# Patient Record
Sex: Female | Born: 1960 | Race: White | Hispanic: No | Marital: Married | Smoking: Former smoker
Health system: Southern US, Community
[De-identification: ages and names within clinical notes are randomized; demographics above are authoritative.]

## PROBLEM LIST (undated history)

## (undated) DIAGNOSIS — E7800 Pure hypercholesterolemia, unspecified: Secondary | ICD-10-CM

## (undated) DIAGNOSIS — E119 Type 2 diabetes mellitus without complications: Secondary | ICD-10-CM

## (undated) DIAGNOSIS — I10 Essential (primary) hypertension: Secondary | ICD-10-CM

## (undated) DIAGNOSIS — C801 Malignant (primary) neoplasm, unspecified: Secondary | ICD-10-CM

## (undated) DIAGNOSIS — K219 Gastro-esophageal reflux disease without esophagitis: Secondary | ICD-10-CM

## (undated) DIAGNOSIS — Z87442 Personal history of urinary calculi: Secondary | ICD-10-CM

## (undated) DIAGNOSIS — M1990 Unspecified osteoarthritis, unspecified site: Secondary | ICD-10-CM

## (undated) DIAGNOSIS — I499 Cardiac arrhythmia, unspecified: Secondary | ICD-10-CM

## (undated) HISTORY — PX: KNEE ARTHROSCOPY: SUR90

## (undated) HISTORY — PX: COLONOSCOPY: SHX174

## (undated) HISTORY — DX: Type 2 diabetes mellitus without complications: E11.9

## (undated) HISTORY — PX: WISDOM TOOTH EXTRACTION: SHX21

## (undated) HISTORY — PX: OTHER SURGICAL HISTORY: SHX169

---

## 1999-11-22 ENCOUNTER — Encounter: Payer: Self-pay | Admitting: Emergency Medicine

## 1999-11-22 ENCOUNTER — Emergency Department (HOSPITAL_COMMUNITY): Admission: EM | Admit: 1999-11-22 | Discharge: 1999-11-22 | Payer: Self-pay | Admitting: Emergency Medicine

## 1999-12-10 ENCOUNTER — Encounter: Payer: Self-pay | Admitting: Urology

## 1999-12-10 ENCOUNTER — Encounter: Admission: RE | Admit: 1999-12-10 | Discharge: 1999-12-10 | Payer: Self-pay | Admitting: Urology

## 2001-12-22 ENCOUNTER — Emergency Department (HOSPITAL_COMMUNITY): Admission: EM | Admit: 2001-12-22 | Discharge: 2001-12-22 | Payer: Self-pay | Admitting: Emergency Medicine

## 2001-12-22 ENCOUNTER — Encounter: Payer: Self-pay | Admitting: Emergency Medicine

## 2004-07-15 ENCOUNTER — Other Ambulatory Visit: Admission: RE | Admit: 2004-07-15 | Discharge: 2004-07-15 | Payer: Self-pay | Admitting: Obstetrics & Gynecology

## 2010-05-05 ENCOUNTER — Encounter: Admission: RE | Admit: 2010-05-05 | Discharge: 2010-05-05 | Payer: Self-pay | Admitting: Family Medicine

## 2017-01-06 DIAGNOSIS — Z1231 Encounter for screening mammogram for malignant neoplasm of breast: Secondary | ICD-10-CM | POA: Diagnosis not present

## 2017-01-06 DIAGNOSIS — Z01419 Encounter for gynecological examination (general) (routine) without abnormal findings: Secondary | ICD-10-CM | POA: Diagnosis not present

## 2017-01-26 DIAGNOSIS — E119 Type 2 diabetes mellitus without complications: Secondary | ICD-10-CM | POA: Diagnosis not present

## 2017-01-26 DIAGNOSIS — I129 Hypertensive chronic kidney disease with stage 1 through stage 4 chronic kidney disease, or unspecified chronic kidney disease: Secondary | ICD-10-CM | POA: Diagnosis not present

## 2017-01-26 DIAGNOSIS — Z0000 Encounter for general adult medical examination without abnormal findings: Secondary | ICD-10-CM | POA: Diagnosis not present

## 2017-01-26 DIAGNOSIS — E7800 Pure hypercholesterolemia, unspecified: Secondary | ICD-10-CM | POA: Diagnosis not present

## 2017-08-04 DIAGNOSIS — E7800 Pure hypercholesterolemia, unspecified: Secondary | ICD-10-CM | POA: Diagnosis not present

## 2017-08-04 DIAGNOSIS — E119 Type 2 diabetes mellitus without complications: Secondary | ICD-10-CM | POA: Diagnosis not present

## 2017-08-04 DIAGNOSIS — I129 Hypertensive chronic kidney disease with stage 1 through stage 4 chronic kidney disease, or unspecified chronic kidney disease: Secondary | ICD-10-CM | POA: Diagnosis not present

## 2017-09-20 DIAGNOSIS — E119 Type 2 diabetes mellitus without complications: Secondary | ICD-10-CM | POA: Diagnosis not present

## 2018-02-17 DIAGNOSIS — E7800 Pure hypercholesterolemia, unspecified: Secondary | ICD-10-CM | POA: Diagnosis not present

## 2018-02-17 DIAGNOSIS — Z0000 Encounter for general adult medical examination without abnormal findings: Secondary | ICD-10-CM | POA: Diagnosis not present

## 2018-02-17 DIAGNOSIS — I129 Hypertensive chronic kidney disease with stage 1 through stage 4 chronic kidney disease, or unspecified chronic kidney disease: Secondary | ICD-10-CM | POA: Diagnosis not present

## 2018-02-17 DIAGNOSIS — E119 Type 2 diabetes mellitus without complications: Secondary | ICD-10-CM | POA: Diagnosis not present

## 2018-06-07 DIAGNOSIS — Z124 Encounter for screening for malignant neoplasm of cervix: Secondary | ICD-10-CM | POA: Diagnosis not present

## 2018-06-07 DIAGNOSIS — Z6836 Body mass index (BMI) 36.0-36.9, adult: Secondary | ICD-10-CM | POA: Diagnosis not present

## 2018-06-07 DIAGNOSIS — Z01419 Encounter for gynecological examination (general) (routine) without abnormal findings: Secondary | ICD-10-CM | POA: Diagnosis not present

## 2018-06-07 DIAGNOSIS — Z1231 Encounter for screening mammogram for malignant neoplasm of breast: Secondary | ICD-10-CM | POA: Diagnosis not present

## 2018-08-23 DIAGNOSIS — I129 Hypertensive chronic kidney disease with stage 1 through stage 4 chronic kidney disease, or unspecified chronic kidney disease: Secondary | ICD-10-CM | POA: Diagnosis not present

## 2018-08-23 DIAGNOSIS — E7800 Pure hypercholesterolemia, unspecified: Secondary | ICD-10-CM | POA: Diagnosis not present

## 2018-08-23 DIAGNOSIS — M25562 Pain in left knee: Secondary | ICD-10-CM | POA: Diagnosis not present

## 2018-08-23 DIAGNOSIS — N183 Chronic kidney disease, stage 3 (moderate): Secondary | ICD-10-CM | POA: Diagnosis not present

## 2018-08-23 DIAGNOSIS — E1169 Type 2 diabetes mellitus with other specified complication: Secondary | ICD-10-CM | POA: Diagnosis not present

## 2018-09-06 DIAGNOSIS — M1712 Unilateral primary osteoarthritis, left knee: Secondary | ICD-10-CM | POA: Diagnosis not present

## 2018-09-13 DIAGNOSIS — M1712 Unilateral primary osteoarthritis, left knee: Secondary | ICD-10-CM | POA: Diagnosis not present

## 2018-09-20 DIAGNOSIS — M1712 Unilateral primary osteoarthritis, left knee: Secondary | ICD-10-CM | POA: Diagnosis not present

## 2018-09-21 DIAGNOSIS — E119 Type 2 diabetes mellitus without complications: Secondary | ICD-10-CM | POA: Diagnosis not present

## 2018-11-13 DIAGNOSIS — J0140 Acute pansinusitis, unspecified: Secondary | ICD-10-CM | POA: Diagnosis not present

## 2018-12-01 DIAGNOSIS — Z1211 Encounter for screening for malignant neoplasm of colon: Secondary | ICD-10-CM | POA: Diagnosis not present

## 2018-12-01 DIAGNOSIS — D122 Benign neoplasm of ascending colon: Secondary | ICD-10-CM | POA: Diagnosis not present

## 2018-12-01 DIAGNOSIS — K5730 Diverticulosis of large intestine without perforation or abscess without bleeding: Secondary | ICD-10-CM | POA: Diagnosis not present

## 2019-03-03 DIAGNOSIS — R109 Unspecified abdominal pain: Secondary | ICD-10-CM | POA: Diagnosis not present

## 2019-10-03 DIAGNOSIS — E1169 Type 2 diabetes mellitus with other specified complication: Secondary | ICD-10-CM | POA: Diagnosis not present

## 2019-10-03 DIAGNOSIS — I129 Hypertensive chronic kidney disease with stage 1 through stage 4 chronic kidney disease, or unspecified chronic kidney disease: Secondary | ICD-10-CM | POA: Diagnosis not present

## 2019-10-03 DIAGNOSIS — E7800 Pure hypercholesterolemia, unspecified: Secondary | ICD-10-CM | POA: Diagnosis not present

## 2019-10-03 DIAGNOSIS — N183 Chronic kidney disease, stage 3 unspecified: Secondary | ICD-10-CM | POA: Diagnosis not present

## 2019-11-05 ENCOUNTER — Other Ambulatory Visit: Payer: Self-pay

## 2019-11-07 ENCOUNTER — Ambulatory Visit (INDEPENDENT_AMBULATORY_CARE_PROVIDER_SITE_OTHER): Payer: 59 | Admitting: Internal Medicine

## 2019-11-07 ENCOUNTER — Other Ambulatory Visit: Payer: Self-pay

## 2019-11-07 ENCOUNTER — Encounter: Payer: Self-pay | Admitting: Internal Medicine

## 2019-11-07 VITALS — BP 124/80 | HR 80 | Temp 98.3°F | Ht 63.0 in | Wt 213.4 lb

## 2019-11-07 DIAGNOSIS — E1165 Type 2 diabetes mellitus with hyperglycemia: Secondary | ICD-10-CM

## 2019-11-07 DIAGNOSIS — R739 Hyperglycemia, unspecified: Secondary | ICD-10-CM

## 2019-11-07 DIAGNOSIS — E785 Hyperlipidemia, unspecified: Secondary | ICD-10-CM | POA: Diagnosis not present

## 2019-11-07 LAB — GLUCOSE, POCT (MANUAL RESULT ENTRY): POC Glucose: 232 mg/dl — AB (ref 70–99)

## 2019-11-07 MED ORDER — INSULIN PEN NEEDLE 32G X 4 MM MISC: 1.0000 | Freq: Every day | 4 refills | Status: DC

## 2019-11-07 MED ORDER — LANTUS SOLOSTAR 100 UNIT/ML ~~LOC~~ SOPN: 24.0000 [IU] | PEN_INJECTOR | Freq: Every day | SUBCUTANEOUS | 3 refills | Status: DC

## 2019-11-07 NOTE — Progress Notes (Signed)
Name: Shelley Dixon  MRN/ DOB: [id number], 05-28-1961   Age/ Sex: 59 y.o., female    PCP: Shelley Arabian, MD   Reason for Endocrinology Evaluation: Type 2 Diabetes Mellitus     Date of Initial Endocrinology Visit: 11/08/2019     PATIENT IDENTIFIER: Shelley Dixon is a 59 y.o. female with a past medical history of T2Dm, HTN, CKD and Dyslipidemia . The patient presented for initial endocrinology clinic visit on 11/08/2019 for consultative assistance with her diabetes management.    HPI: [name] was    Diagnosed with DM many years ago  Prior Medications tried/Intolerance: Invokana - yeast infections. Trulicity causes nausea.  Currently checking blood sugars  Not as often  Hypoglycemia episodes : no           Hemoglobin A1c has ranged from 5.8% years ago , peaking at 10.9%  in 2021. Patient required assistance for hypoglycemia: no  Patient has required hospitalization within the last 1 year from hyper or hypoglycemia: no  In terms of diet, the patient eats a pack of nabs in the morning, then eats proper lunch and dinner. Will have a snack mid morning and afternoon and again a bedtime.   Pt admits to non-compliance with oral glycemic for the past few months   Works for the [employer]: Repaglinide 1 mg BID  Metformin 500 mg 1 tab QAM, 2 tabs QPM  Glimepiride 4 mg with breakfast  Trulicity 1.5 mg weekly    Statin: Yes ACE-I/ARB: yes Prior Diabetic Education: no   METER DOWNLOAD SUMMARY: Did not bring   DIABETIC COMPLICATIONS: Microvascular complications:    Denies: retinopathy, neuropathy , CKD   Last eye exam: Completed 09/24/2019  Macrovascular complications:    Denies: CAD, PVD, CVA   PAST HISTORY:  Past Medical History: No past medical history on file.  Past Surgical History: N/A   Social History:  reports that she has quit smoking. She has never used smokeless tobacco. She reports current alcohol  use of about 2.0 standard drinks of alcohol per week. She reports that she does not use drugs.  Family History: No family history on file.   HOME MEDICATIONS: Allergies as of 11/07/2019      Reactions   Atorvastatin    Elevated LFT   Bydureon [exenatide]    Injection site pain   Pioglitazone    swelling   Simvastatin    ineffective      Medication List       Accurate as of November 07, 2019 11:59 PM. If you have any questions, ask your nurse or doctor.        STOP taking these medications   glimepiride 4 MG tablet Commonly known as: AMARYL Stopped by: Dorita Sciara, MD   repaglinide 1 MG tablet Commonly known as: PRANDIN Stopped by: Dorita Sciara, MD   Trulicity 1.5 ZL/9.3TT Sopn Generic drug: Dulaglutide Stopped by: Dorita Sciara, MD     TAKE these medications   Accu-Chek Aviva Plus w/Device Kit by Does not apply route.   ALPRAZolam 0.5 MG tablet Commonly known as: XANAX Take 0.25-0.5 mg by mouth 2 (two) times daily as needed.   atenolol 50 MG tablet Commonly known as: TENORMIN Take 100 mg by mouth daily.   Insulin Pen Needle 32G X 4 MM Misc 1 Device by Does not apply route daily. Started by: Dorita Sciara, MD   Lantus SoloStar 100 UNIT/ML Solostar Pen  Generic drug: Insulin Glargine Inject 24 Units into the skin daily. Started by: Dorita Sciara, MD   lisinopril 10 MG tablet Commonly known as: ZESTRIL lisinopril  twice a day   metFORMIN 500 MG 24 hr tablet Commonly known as: GLUCOPHAGE-XR TAKE 1 TABLET IN THE MORNING AND 2 TABLETS AT NIGHT WITH FOOD   NexIUM 40 MG capsule Generic drug: esomeprazole TAKE ONE CAPSULE BY MOUTH EVERY DAY FOR ACID REFLUX   pravastatin 20 MG tablet Commonly known as: PRAVACHOL Take 20 mg by mouth daily.        ALLERGIES: Allergies  Allergen Reactions  . Atorvastatin     Elevated LFT  . Bydureon [Exenatide]     Injection site pain  . Pioglitazone     swelling  .  Simvastatin     ineffective     REVIEW OF SYSTEMS: A comprehensive ROS was conducted with the patient and is negative except as per HPI and below:  Review of Systems  Gastrointestinal: Positive for diarrhea and nausea.  Genitourinary: Positive for frequency.  Neurological: Negative for tingling and tremors.  Endo/Heme/Allergies: Positive for polydipsia.      OBJECTIVE:   VITAL SIGNS: BP 124/80 (BP Location: Right Arm, Patient Position: Sitting, Cuff Size: Large)   Pulse 80   Temp 98.3 F (36.8 C)   Ht 5' 3"  (1.6 m)   Wt 213 lb 6.4 oz (96.8 kg)   SpO2 98%   BMI 37.80 kg/m    PHYSICAL EXAM:  General: Pt appears well and is in NAD  HEENT:  Eyes: External eye exam normal without stare, lid lag or exophthalmos.  EOM intact.    Neck: General: Supple without adenopathy or carotid bruits. Thyroid: Thyroid size normal.  No goiter or nodules appreciated. No thyroid bruit.  Lungs: Clear with good BS bilat with no rales, rhonchi, or wheezes  Heart: RRR with normal S1 and S2 and no gallops; no murmurs; no rub  Abdomen: Normoactive bowel sounds, soft, nontender, without masses or organomegaly palpable  Extremities:  Lower extremities - No pretibial edema. No lesions.  Skin: Normal texture and temperature to palpation.   Neuro: MS is good with appropriate affect, pt is alert and Ox3    DM foot exam: 11/07/2019  The skin of the feet is intact without sores or ulcerations. The pedal pulses are 2+ on right and 2+ on left. The sensation is intact to a screening 5.07, 10 gram monofilament bilaterally   DATA REVIEWED: 10/04/2019  LDL 89 mg/dL TG 276 A1c 10.9% Urine micro/Alb ratio 17.8 BUN/Cr 17/0.94   ASSESSMENT / PLAN / RECOMMENDATIONS:   1) Type 2 Diabetes Mellitus, Poorly controlled, Without complications - Most recent A1c of 10.9 %. Goal A1c < 7.0 %.    - I have discussed with the patient the pathophysiology of diabetes. We went over the natural progression of the  disease. We talked about both insulin resistance and insulin deficiency. We stressed the importance of lifestyle changes including diet and exercise. I explained the complications associated with diabetes including retinopathy, nephropathy, neuropathy as well as increased risk of cardiovascular disease. We went over the benefit seen with glycemic control.   - I explained to the patient that diabetic patients are at higher than normal risk for amputations.   -We discussed treatment options with an A1c over 10%, I have recommended insulin as way of reducing her hypoglycemia rapidly and safely, I also gave her the option of maximizing her the [place] night and glimepiride dosing -  Patient in agreement and starting insulin, will make adjustments as below -She was encouraged to check her glucose fasting and bedtime, we discussed the importance of having these data available to me. -Patient educated on the use of insulin pen today.  MEDICATIONS: Stop Repaglinide  Stop Glimepiride  Start Lantus 24 units daily  Continue Trulicity 1.5 mg weekly  Continue metformin 500 mg XR 1 tablet with breakfast and 2 tablets with supper   EDUCATION / INSTRUCTIONS:  BG monitoring instructions: Patient is instructed to check her blood sugars 2 times a day, fasting and bedtime.  Call [facility] if: BG persistently < 70 or > 300. . I reviewed the Rule of 15 for the treatment of hypoglycemia in detail with the patient. Literature supplied.   2) Diabetic complications:   Eye: Does not have known diabetic retinopathy.   Neuro/ Feet: Does not have known diabetic peripheral neuropathy.  Renal: Patient does not have known baseline CKD. She is  on an ACEI/ARB at present   3) Lipids: Patient is pravastatin 20 mg daily LDL is acceptable.  We discussed the cardiovascular benefits of statins.  Follow-up in 3 months      Signed electronically by: Mack Guise, MD  [facility]  Endocrinology  [address] [place], [place] [address] Phone: [phone] FAX: [phone]   CC: Shelley Arabian, MD [address] [place] [address] Phone: [phone]  Fax: [phone]    Return to Endocrinology clinic as below: Future Appointments  Date Time Provider [place]  12/07/2019  1:00 PM Clydell Hakim, RD [place] [facility]  02/06/2020  7:30 AM Tamani Durney, Melanie Crazier, MD [facility] None

## 2019-11-07 NOTE — Patient Instructions (Addendum)
-   Stop Repaglinide - Stop Glimepiride - Stop Trulicity  - Continue Metfomrin 1 tablet in the morning and 2 tablets with Supper - Start Lantus 24 units daily    - Try and check your sugar fasting and at bedtime when you can, please bring your meter on next visit     Choose healthy, lower carb lower calorie snacks: toss salad, cooked vegetables, cottage cheese, peanut butter, low fat cheese / string cheese, lower sodium deli meat, tuna salad or chicken salad     HOW TO TREAT LOW BLOOD SUGARS (Blood sugar LESS THAN 70 MG/DL)  Please follow the RULE OF 15 for the treatment of hypoglycemia treatment (when your (blood sugars are less than 70 mg/dL)    STEP 1: Take 15 grams of carbohydrates when your blood sugar is low, which includes:   3-4 GLUCOSE TABS  OR  3-4 OZ OF JUICE OR REGULAR SODA OR  ONE TUBE OF GLUCOSE GEL     STEP 2: RECHECK blood sugar in 15 MINUTES STEP 3: If your blood sugar is still low at the 15 minute recheck --> then, go back to STEP 1 and treat AGAIN with another 15 grams of carbohydrates. 1 tablet with breakfast and 2 tablets with dinner

## 2019-11-08 ENCOUNTER — Encounter: Payer: Self-pay | Admitting: Internal Medicine

## 2019-11-08 DIAGNOSIS — E785 Hyperlipidemia, unspecified: Secondary | ICD-10-CM | POA: Insufficient documentation

## 2019-11-08 DIAGNOSIS — E1165 Type 2 diabetes mellitus with hyperglycemia: Secondary | ICD-10-CM | POA: Insufficient documentation

## 2019-12-07 ENCOUNTER — Other Ambulatory Visit: Payer: Self-pay

## 2019-12-07 ENCOUNTER — Encounter: Payer: Self-pay | Admitting: Dietician

## 2019-12-07 ENCOUNTER — Encounter: Payer: Self-pay | Admitting: Internal Medicine

## 2019-12-07 ENCOUNTER — Encounter: Payer: 59 | Attending: Internal Medicine | Admitting: Dietician

## 2019-12-07 DIAGNOSIS — E1165 Type 2 diabetes mellitus with hyperglycemia: Secondary | ICD-10-CM

## 2019-12-07 MED ORDER — LANTUS SOLOSTAR 100 UNIT/ML ~~LOC~~ SOPN: 28.0000 [IU] | PEN_INJECTOR | Freq: Every day | SUBCUTANEOUS | 3 refills | Status: DC

## 2019-12-07 NOTE — Progress Notes (Signed)
Diabetes Self-Management Education  Visit Type: First/Initial  Appt. Start Time: 1300 Appt. End Time: 1500  12/07/2019  [name], identified by name and date of birth, is a 59 y.o. female with a diagnosis of Diabetes: Type 2.   ASSESSMENT Patient is here today with her husband.  History of type 2 diabetes since about 2011.  HTN, CKD and dylsipidemia.  She states that the hardest thing about diabetes is her sweet tooth.  She also states that she hates to take medication.  She states that she has been much more consistent with her medcation since seeing Dr. Kelton Pillar.  Medications include Lantus 24 units q HS, Metformin ER which causes diarrhea.  She takes Metformin with breakfast and bedtime.  Suggested that she take the Metformin with dinner. She tried Trulicity but had increased nausea, increased yeast infections on Invokana.  Weight today:  217 lbs increased from 213 lbs in the last month.  Lost to [id number] lbs on Trulicity due to nausea.  Patient lives with her husband.  He does most of the shopping and all of the cooking.  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 She has problems with her knees and cannot exercise.  (history of torn meniscus, difficulty getting into the shower).  They do have a pool and enjoy it in the summer. 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   Increased stress with oldest daughter and new computer system at work. Sleeps well on xanax and melatonin but does wake 2-3 times per night to urinate. Changed to [employer] bread, used to use heavy mayo, working to avoid snacks, Changed breakfast from the [employer] to a [employer] (which is very high in fat).  Height 5\' 3"  (1.6 m), weight 217 lb (98.4 kg). Body mass index is 38.44 kg/m.  Diabetes Self-Management Education - 12/07/19 1317      Visit Information   Visit Type  First/Initial      Initial Visit   Diabetes Type  Type 2    Are you currently following a meal plan?  No     Date Diagnosed  abpit 2011      Health Coping   How would you rate your overall health?  Good      Psychosocial Assessment   Patient Belief/Attitude about Diabetes  Motivated to manage diabetes   denial in the past   Self-care barriers  None    Self-management support  Doctor's office    Other persons present  Patient;Spouse/SO    Patient Concerns  Nutrition/Meal planning;Glycemic Control;Weight Control    Special Needs  None    Preferred Learning Style  No preference indicated    Learning Readiness  Ready    How often do you need to have someone help you when you read instructions, pamphlets, or other written materials from your doctor or pharmacy?  1 - Never    What is the last grade level you completed in school?  12th grade      Pre-Education Assessment   Patient understands the diabetes disease and treatment process.  Needs Instruction    Patient understands incorporating nutritional management into lifestyle.  Needs Instruction    Patient undertands incorporating physical activity into lifestyle.  Needs Instruction    Patient understands using medications safely.  Needs Instruction    Patient understands monitoring blood glucose, interpreting and using results  Needs Instruction    Patient understands prevention, detection, and treatment of acute complications.  Needs Instruction  Patient understands prevention, detection, and treatment of chronic complications.  Needs Instruction    Patient understands how to develop strategies to address psychosocial issues.  Needs Instruction    Patient understands how to develop strategies to promote health/change behavior.  Needs Instruction      Complications   Last HgB A1C per patient/outside source  10.9 %   09/2019   How often do you check your blood sugar?  1-2 times/day    Fasting Blood glucose range (mg/dL)  130-179;180-200;>200    Number of hypoglycemic episodes per month  0    Number of hyperglycemic episodes per week  21     Can you tell when your blood sugar is high?  Yes   headache and vision changes   What do you do if your blood sugar is high?  nothing    Have you had a dilated eye exam in the past 12 months?  Yes    Have you had a dental exam in the past 12 months?  Yes    Are you checking your feet?  Yes    How many days per week are you checking your feet?  6      Dietary Intake   Breakfast  NABS in the past but changed to [place] bacon, egg sandwich on the weekend ('brunch")    Snack (morning)  apple jacks (dry)    Lunch  chicken salad sandwich or ham and cheese sandwich and chips OR skips OR pizza OR sub and chips    Snack (afternoon)  grapes, apple slices, occasional cookie or mini chocolate    Dinner  Subway (6" coldcut on Wheat with small amout of mayo) OR sauteed chicken and vegetables, white rice OR chicken, potatoes, vegetables OR shrimp, 1/2 baked potato, slaw    Snack (evening)  has in the past but has been working to avoid this.    Beverage(s)  diet Mt. Dew (48-64 oz per day ) water (about 32 oz per day), unsweetened tea      Exercise   Exercise Type  ADL's      Patient Education   Previous Diabetes Education  No    Disease state   Definition of diabetes, type 1 and 2, and the diagnosis of diabetes;Other (comment)   discussed insulin resistance   Nutrition management   Role of diet in the treatment of diabetes and the relationship between the three main macronutrients and blood glucose level;Food label reading, portion sizes and measuring food.;Carbohydrate counting;Meal options for control of blood glucose level and chronic complications.;Reviewed blood glucose goals for pre and post meals and how to evaluate the patients' food intake on their blood glucose level.    Physical activity and exercise   Role of exercise on diabetes management, blood pressure control and cardiac health.;Helped patient identify appropriate exercises in relation to his/her diabetes, diabetes  complications and other health issue.    Medications  Reviewed patients medication for diabetes, action, purpose, timing of dose and side effects.;Taught/reviewed insulin injection, site rotation, insulin storage and needle disposal.    Monitoring  Purpose and frequency of SMBG.;Identified appropriate SMBG and/or A1C goals.    Acute complications  Taught treatment of hypoglycemia - the 15 rule.;Discussed and identified patients' treatment of hyperglycemia.    Chronic complications  Relationship between chronic complications and blood glucose control;Dental care;Retinopathy and reason for yearly dilated eye exams;Reviewed with patient heart disease, higher risk of, and prevention    Psychosocial adjustment  Worked with patient to identify barriers to care and solutions;Role of stress on diabetes;Identified and addressed patients feelings and concerns about diabetes;Brainstormed with patient on coping mechanisms for social situations, getting support from significant others, dealing with feelings about diabetes    Personal strategies to promote health  Lifestyle issues that need to be addressed for better diabetes care      Individualized Goals (developed by patient)   Nutrition  General guidelines for healthy choices and portions discussed    Physical Activity  Exercise 3-5 times per week;15 minutes per day    Medications  take my medication as prescribed    Monitoring   test my blood glucose as discussed    Reducing Risk  do foot checks daily;increase portions of healthy fats    Health Coping  discuss diabetes with (comment)   MD, RD, CDE     Post-Education Assessment   Patient understands the diabetes disease and treatment process.  Demonstrates understanding / competency    Patient understands incorporating nutritional management into lifestyle.  Needs Review    Patient undertands incorporating physical activity into lifestyle.  Needs Review    Patient understands using medications safely.   Demonstrates understanding / competency    Patient understands monitoring blood glucose, interpreting and using results  Demonstrates understanding / competency    Patient understands prevention, detection, and treatment of acute complications.  Demonstrates understanding / competency    Patient understands prevention, detection, and treatment of chronic complications.  Demonstrates understanding / competency    Patient understands how to develop strategies to address psychosocial issues.  Demonstrates understanding / competency      Outcomes   Expected Outcomes  Demonstrated interest in learning. Expect positive outcomes    Future DMSE  4-6 wks    Program Status  Not Completed       Individualized Plan for Diabetes Self-Management Training:   Learning Objective:  Patient will have a greater understanding of diabetes self-management. Patient education plan is to attend individual and/or group sessions per assessed needs and concerns.   Plan:   Patient Instructions  Drink more water and continue to decrease the diet soda. Eat more at the table Decrease the fat with breakfast.  Continue to be mindful about the mayo, butter, and salad dressing.  Before eating a snack ask, "Am I hungry or eating for another reason?"  Consider a puzzle or calling someone.  If you are hungry consider sugar free jello, sugar free popsicle, raw veges, cheese stick.  Be active when you able.  Get out your chair every 30 minutes.  Use the pool during the summer  Consider "sit and be fit" on You Tube     Expected Outcomes:  Demonstrated interest in learning. Expect positive outcomes  Education material provided: ADA - How to Thrive: A Guide for Your Journey with Diabetes, Meal plan card, My Plate and Snack sheet  If problems or questions, patient to contact team via:  Phone and Email  Future DSME appointment: 4-6 wks

## 2019-12-07 NOTE — Patient Instructions (Addendum)
Drink more water and continue to decrease the diet soda. Eat more at the table Decrease the fat with breakfast.  Continue to be mindful about the mayo, butter, and salad dressing.  Before eating a snack ask, "Am I hungry or eating for another reason?"  Consider a puzzle or calling someone.  If you are hungry consider sugar free jello, sugar free popsicle, raw veges, cheese stick.  Be active when you able.  Get out your chair every 30 minutes.  Use the pool during the summer  Consider "sit and be fit" on You Tube

## 2020-01-18 ENCOUNTER — Encounter: Payer: Self-pay | Admitting: Dietician

## 2020-01-18 ENCOUNTER — Encounter: Payer: 59 | Attending: Internal Medicine | Admitting: Dietician

## 2020-01-18 ENCOUNTER — Other Ambulatory Visit: Payer: Self-pay

## 2020-01-18 DIAGNOSIS — E1165 Type 2 diabetes mellitus with hyperglycemia: Secondary | ICD-10-CM | POA: Diagnosis not present

## 2020-01-18 NOTE — Progress Notes (Signed)
Diabetes Self-Management Education  Visit Type:  Follow-up  Appt. Start Time: 0910 Appt. End Time: [id number]  01/18/2020  [name], identified by name and date of birth, is a 59 y.o. female with a diagnosis of Diabetes: Type 2.   ASSESSMENT Patient is here today alone.  Since last visti 12/07/2019, she is-   Counting carbohydrates. Soda free (no regular or diet)  Blood sugar is lowering.  She can see a correlation between what she is eating and her blood glucose.  She has started to get a headache when her blood sugar is high.   She states that her sleep is better when she takes her medication with her food. She states that her energy level has improved. She is moving more and yesterday was over 5,0000 steps.  She is doing some light weights 3-4 times per week.   Wears a Ecologist She is frustrated that she is not losing weight. She states that she is retaining a lot of fluid and does not know why.  States that she is drinking a lot of water. Some processed meat and other foods that are high in sodium. States that her clothes are fitting better. Not working as much overtime.   Going to the beach next week and is opening her pool in 2 weeks. Still struggles with emotional eating and will go to bed early because of this.  History of type 2 diabetes since about 2011.  HTN, CKD and dylsipidemia.  She states that the hardest thing about diabetes is her sweet tooth.  She also states that she hates to take medication.  She states that she has been much more consistent with her medcation since seeing Dr. Kelton Dixon.  Medications include:  Metformin ER  (diarrhea resolving), Lantus 28 units q HS  She tried Trulicity but had increased nausea, increased yeast infections on Invokana.  Weight today:  218 lbs 01/18/20 217 lbs 12/07/19 213 lbs 11/09/19  Lost to [id number] lbs on Trulicity due to nausea.  Patient lives with her husband.  He does most of the shopping and all of the cooking.  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 She has problems with her knees and cannot exercise.  (history of torn meniscus, difficulty getting into the shower).  They do have a pool and enjoy it in the summer. 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   Increased stress with oldest daughter and new computer system at work. Sleeps well on xanax and melatonin but does wake 2-3 times per night to urinate. Changed to [employer] bread, used to use heavy mayo, working to avoid snacks, Changed breakfast from the [employer] to a [employer] (which is very high in fat).  Occasional skinny pop popcorn or pretzels or hummus Still struggles with emotional eating.  Will go to bed early to deal with this. There were no vitals taken for this visit. There is no height or weight on file to calculate BMI.   Diabetes Self-Management Education - 01/18/20 1859      Initial Visit   What type of meal plan do you follow?  carb mod      Psychosocial Assessment   Learning Readiness  Ready      Pre-Education Assessment   Patient understands the diabetes disease and treatment process.  Demonstrates understanding / competency    Patient understands incorporating nutritional management into lifestyle.  Needs Review    Patient undertands incorporating physical activity into lifestyle.  Needs Review    Patient understands using medications safely.  Demonstrates understanding / competency    Patient understands monitoring blood glucose, interpreting and using results  Demonstrates understanding / competency    Patient understands prevention, detection, and treatment of acute complications.  Demonstrates understanding / competency    Patient understands prevention, detection, and treatment of chronic complications.  Demonstrates understanding / competency    Patient understands how to develop strategies to address psychosocial issues.  Demonstrates understanding / competency    Patient understands how to  develop strategies to promote health/change behavior.  Needs Review      Dietary Intake   Breakfast  Yogurt parfait (homemade with lowfat yogurt) OR 2 sausage biscuit sliders with mustard OR once per week egg McMuffin OR rare 1/2 [employer] bagel with reduced fat cream cheese    Lunch  Sandwich from home (ham or [place] on honey wheat) OR grilled chicken sandwich, occasional 1/2    Quarry manager or sauteed chicken, broccoli, cauli rice or two other vegetables    Snack (evening)  Occasional Skinny Popcorn    Beverage(s)  About 180 oz water per day, occasional Premier Protein Shake      Exercise   Exercise Type  Light (walking / raking leaves)    How many days per week to you exercise?  7    How many minutes per day do you exercise?  30    Total minutes per week of exercise  210      Patient Education   Previous Diabetes Education  Yes (please comment)   12/2019   Nutrition management   Meal options for control of blood glucose level and chronic complications.;Other (comment)   low sodium, adequate but not excessive water intake   Medications  Reviewed patients medication for diabetes, action, purpose, timing of dose and side effects.      Individualized Goals (developed by patient)   Nutrition  General guidelines for healthy choices and portions discussed    Physical Activity  Exercise 5-7 days per week;30 minutes per day    Medications  take my medication as prescribed    Monitoring   test my blood glucose as discussed    Health Coping  discuss diabetes with (comment)      Patient Self-Evaluation of Goals - Patient rates self as meeting previously set goals (% of time)   Nutrition  >75%    Physical Activity  >75%    Medications  >75%    Monitoring  >75%    Problem Solving  >75%    Reducing Risk  >75%    Health Coping  >75%      Post-Education Assessment   Patient understands the diabetes disease and treatment process.  Demonstrates understanding / competency    Patient  understands incorporating nutritional management into lifestyle.  Needs Review    Patient undertands incorporating physical activity into lifestyle.  Demonstrates understanding / competency    Patient understands using medications safely.  Demonstrates understanding / competency    Patient understands monitoring blood glucose, interpreting and using results  Demonstrates understanding / competency    Patient understands prevention, detection, and treatment of acute complications.  Demonstrates understanding / competency    Patient understands prevention, detection, and treatment of chronic complications.  Demonstrates understanding / competency    Patient understands how to develop strategies to address psychosocial issues.  Demonstrates understanding / competency    Patient understands how to develop strategies to promote health/change behavior.  Needs Review      Outcomes   Program Status  Not Completed      Subsequent Visit   Since your last visit have you continued or begun to take your medications as prescribed?  Yes    Since your last visit have you had your blood pressure checked?  Yes    Since your last visit have you experienced any weight changes?  Gain    Weight Gain (lbs)  1    Since your last visit, are you checking your blood glucose at least once a day?  Yes       Learning Objective:  Patient will have a greater understanding of diabetes self-management. Patient education plan is to attend individual and/or group sessions per assessed needs and concerns.   Plan:   Patient Instructions  Doristine Devoid job on the changes that you have made!  Being more active  Mindful eating  Drinking more water and off the soda habit  Decreasing your fat intake Find more ways to add vegetables (wraps, salad, raw with sandwich) Breakfast idea (breakfast burrito with sauteed onions, red pepper, beans, lower carb or whole wheat tortilla) Consider adding a carbohydrate to dinner to see if this  helps your late hunger. Read your labels for sodium.  Choose more fresh rather than processed. Consider decreasing your water to 1 gallon per day.       Expected Outcomes:  Demonstrated interest in learning. Expect positive outcomes   If problems or questions, patient to contact team via:  Phone and Email  Future DSME appointment: - 2 months

## 2020-01-18 NOTE — Patient Instructions (Addendum)
Great job on the changes that you have made!  Being more active  Mindful eating  Drinking more water and off the soda habit  Decreasing your fat intake Find more ways to add vegetables (wraps, salad, raw with sandwich) Breakfast idea (breakfast burrito with sauteed onions, red pepper, beans, lower carb or whole wheat tortilla) Consider adding a carbohydrate to dinner to see if this helps your late hunger. Read your labels for sodium.  Choose more fresh rather than processed. Consider decreasing your water to 1 gallon per day.

## 2020-02-01 ENCOUNTER — Other Ambulatory Visit: Payer: Self-pay | Admitting: Family Medicine

## 2020-02-01 DIAGNOSIS — Z1231 Encounter for screening mammogram for malignant neoplasm of breast: Secondary | ICD-10-CM

## 2020-02-04 ENCOUNTER — Other Ambulatory Visit: Payer: Self-pay

## 2020-02-05 NOTE — Progress Notes (Signed)
Name: Shelley Dixon  Age/ Sex: 59 y.o., female   MRN/ DOB: [id number], Jun 17, 1961     PCP: Gaynelle Arabian, MD   Reason for Endocrinology Evaluation: Type 2 Diabetes Mellitus  Initial Endocrine Consultative Visit: 11/08/2019    PATIENT IDENTIFIER: Shelley Dixon is a 59 y.o. female with a past medical history of T2Dm, HTN, CKD and Dyslipidemia . The patient has followed with Endocrinology clinic since 11/08/2019 for consultative assistance with management of her diabetes.  DIABETIC HISTORY:  Shelley Dixon was diagnosed with DM many years ago. She reports intolerance to invokana - yeast infections . Her hemoglobin A1c has ranged from 5.8% years ago , peaking at 10.9%  in 2021.  On her initial visit to our clinic she had an A1c of  10.9%  , she was on repaglinide, metformin, glimepiride and truclicity . We switched the repaglinide, and glimepiride to basal insulin, continued trulicity and metformin  SUBJECTIVE:   During the last visit (11/08/2019): A1c 10.9% . We stopped Repaglinide, and Glimepiride and started Lantus, continued Trulicity and Metformin   Today (02/06/2020): Ms. Kehoe is here for a follow up on diabetes management.  She checks her blood sugars 1 times daily, preprandial to breakfast . The patient has not had hypoglycemic episodes since the last clinic visit.  Has noted LE     HOME DIABETES REGIMEN:  Lantus 28 units daily  Metformin 500 mg XR 1 tablet with breakfast and 2 tablets with supper   Statin: yes ACE-I/ARB: yes    METER DOWNLOAD SUMMARY: Date range evaluated: 4/6-02/06/2020 Fingerstick Blood Glucose Tests = 29 Average Number Tests/Day = 188 Overall Mean FS Glucose = 188 Standard Deviation = 20.6  BG Ranges: Low = 156 High = 227   Hypoglycemic Events/30 Days: BG < 50 = 0 Episodes of symptomatic severe hypoglycemia = 0   DIABETIC COMPLICATIONS: Microvascular complications:    Denies: retinopathy, neuropathy , CKD   Last eye  exam: Completed 09/24/2019  Macrovascular complications:    Denies: CAD, PVD, CVA    HISTORY:  Past Medical History:  Past Medical History:  Diagnosis Date  . Diabetes mellitus without complication [facility])     Past Surgical History: No past surgical history on file.  Social History:  reports that she has quit smoking. She has never used smokeless tobacco. She reports current alcohol use of about 2.0 standard drinks of alcohol per week. She reports that she does not use drugs.  Family History: No family history on file.   HOME MEDICATIONS: Allergies as of 02/06/2020      Reactions   Atorvastatin    Elevated LFT   Bydureon [exenatide]    Injection site pain   Pioglitazone    swelling   Simvastatin    ineffective      Medication List       Accurate as of Feb 06, 2020  7:56 AM. If you have any questions, ask your nurse or doctor.        Accu-Chek Aviva Plus test strip Generic drug: glucose blood 2x daily Started by: Dorita Sciara, MD   Accu-Chek Aviva Plus w/Device Kit by Does not apply route.   ALPRAZolam 0.5 MG tablet Commonly known as: XANAX Take 0.25-0.5 mg by mouth 2 (two) times daily as needed.   atenolol 50 MG tablet Commonly known as: TENORMIN Take 100 mg by mouth daily.   cholecalciferol 25 MCG (1000 UNIT) tablet Commonly known as: VITAMIN D3 Take 1,000 Units by mouth  daily.   Insulin Pen Needle 32G X 4 MM Misc 1 Device by Does not apply route daily.   Lantus SoloStar 100 UNIT/ML Solostar Pen Generic drug: insulin glargine Inject 28 Units into the skin daily.   lisinopril 10 MG tablet Commonly known as: ZESTRIL lisinopril  twice a day   metFORMIN 500 MG 24 hr tablet Commonly known as: GLUCOPHAGE-XR TAKE 1 TABLET IN THE MORNING AND 2 TABLETS AT NIGHT WITH FOOD   NexIUM 40 MG capsule Generic drug: esomeprazole TAKE ONE CAPSULE BY MOUTH EVERY DAY FOR ACID REFLUX   pravastatin 20 MG tablet Commonly known as: PRAVACHOL Take 20  mg by mouth daily.   Trulicity 1.5 VV/6.1YW Sopn Generic drug: Dulaglutide Inject into the skin.   Vitamin B-12 1000 MCG/15ML Liqd Take by mouth.        OBJECTIVE:   Vital Signs: BP 118/82 (BP Location: Left Arm, Patient Position: Sitting, Cuff Size: Large)   Pulse 89   Temp 98.1 F (36.7 C)   Ht 5' 3"  (1.6 m)   Wt 217 lb 3.2 oz (98.5 kg)   SpO2 98%   BMI 38.48 kg/m   Wt Readings from Last 3 Encounters:  02/06/20 217 lb 3.2 oz (98.5 kg)  12/07/19 217 lb (98.4 kg)  11/07/19 213 lb 6.4 oz (96.8 kg)     Exam: General: Pt appears well and is in NAD  Lungs: Clear with good BS bilat with no rales, rhonchi, or wheezes  Heart: RRR with normal S1 and S2 and no gallops; no murmurs; no rub  Abdomen: Normoactive bowel sounds, soft, nontende  Extremities: Trace pretibial edema.  Skin: Normal texture and temperature to palpation. No rash noted. No Acanthosis nigricans/skin tags. No lipohypertrophy.  Neuro: MS is good with appropriate affect, pt is alert and Ox3    DM foot exam: 11/07/2019  The skin of the feet is intact without sores or ulcerations. The pedal pulses are 2+ on right and 2+ on left. The sensation is intact to a screening 5.07, 10 gram monofilament bilaterally   DATA REVIEWED:  10/04/2019  LDL 89 mg/dL TG 276 A1c 10.9% Urine micro/Alb ratio 17.8 BUN/Cr 17/0.94  ASSESSMENT / PLAN / RECOMMENDATIONS:   1) Type 2 Diabetes Mellitus, Poorly controlled, Without complications - Most recent A1c of 9.1 %. Goal A1c < 7.0 %.  - A1c down from 10.9% , I have praised the pt on the improved glycemic control, she continues to meet with our RD and is making lifestyle changes  - We will not be making any changes at this time, I have encouraged her to check glucose at bedtime when possible to see if the basal insulin is appropriate or needs to be adjusted - She is intolerant to SGLT-2 inhibitors and GLP-1 agonists.    MEDICATIONS:  - Continue Metfomrin 1 tablet in the  morning and 2 tablets with Supper - Continue Lantus 28 units daily    EDUCATION / INSTRUCTIONS:  BG monitoring instructions: Patient is instructed to check her blood sugars 2 times a day,  fasting and bedtime .  Call [facility] if: BG persistently < 70 or > 300. . I reviewed the Rule of 15 for the treatment of hypoglycemia in detail with the patient. Literature supplied.    2) Diabetic complications:   Eye: Does not have known diabetic retinopathy.   Neuro/ Feet: Does not have known diabetic peripheral neuropathy .   Renal: Patient does not have known baseline CKD. She  is on an ACEI/ARB at present.     F/U in 4 months    Signed electronically by: Mack Guise, MD  [facility] End[facility]in[facility]  [facility] [address] [place], [place] [address] Phone: [phone] FAX: [phone]   CC: Gaynelle Arabian, MD [address] [place] [address] Phone: [phone]  Fax: [phone]  Return to Endocrinology clinic as below: Future Appointments  Date Time Provider [place]  03/21/2020  9:00 AM Jobe, Barnabas Lister, RD [place] NDM

## 2020-02-06 ENCOUNTER — Ambulatory Visit (INDEPENDENT_AMBULATORY_CARE_PROVIDER_SITE_OTHER): Payer: 59 | Admitting: Internal Medicine

## 2020-02-06 ENCOUNTER — Other Ambulatory Visit: Payer: Self-pay

## 2020-02-06 ENCOUNTER — Encounter: Payer: Self-pay | Admitting: Internal Medicine

## 2020-02-06 VITALS — BP 118/82 | HR 89 | Temp 98.1°F | Ht 63.0 in | Wt 217.2 lb

## 2020-02-06 DIAGNOSIS — E1165 Type 2 diabetes mellitus with hyperglycemia: Secondary | ICD-10-CM

## 2020-02-06 LAB — POCT GLYCOSYLATED HEMOGLOBIN (HGB A1C): Hemoglobin A1C: 9.1 % — AB (ref 4.0–5.6)

## 2020-02-06 MED ORDER — ACCU-CHEK AVIVA PLUS VI STRP: ORAL_STRIP | 12 refills | Status: DC

## 2020-02-06 NOTE — Patient Instructions (Addendum)
-   Continue Metfomrin 1 tablet in the morning and 2 tablets with Supper - Continue Lantus 28 units daily    - Try and check your sugar fasting and at bedtime when you can, please bring your meter on next visit      HOW TO TREAT LOW BLOOD SUGARS (Blood sugar LESS THAN 70 MG/DL)  Please follow the RULE OF 15 for the treatment of hypoglycemia treatment (when your (blood sugars are less than 70 mg/dL)    STEP 1: Take 15 grams of carbohydrates when your blood sugar is low, which includes:   3-4 GLUCOSE TABS  OR  3-4 OZ OF JUICE OR REGULAR SODA OR  ONE TUBE OF GLUCOSE GEL     STEP 2: RECHECK blood sugar in 15 MINUTES STEP 3: If your blood sugar is still low at the 15 minute recheck --> then, go back to STEP 1 and treat AGAIN with another 15 grams of carbohydrates. 1 tablet with breakfast and 2 tablets with dinner

## 2020-03-19 DIAGNOSIS — M1712 Unilateral primary osteoarthritis, left knee: Secondary | ICD-10-CM | POA: Diagnosis not present

## 2020-03-19 DIAGNOSIS — M25561 Pain in right knee: Secondary | ICD-10-CM | POA: Diagnosis not present

## 2020-03-19 DIAGNOSIS — M170 Bilateral primary osteoarthritis of knee: Secondary | ICD-10-CM | POA: Diagnosis not present

## 2020-03-21 ENCOUNTER — Ambulatory Visit: Payer: 59 | Admitting: Dietician

## 2020-05-07 DIAGNOSIS — M170 Bilateral primary osteoarthritis of knee: Secondary | ICD-10-CM | POA: Diagnosis not present

## 2020-05-14 DIAGNOSIS — M25562 Pain in left knee: Secondary | ICD-10-CM | POA: Diagnosis not present

## 2020-05-14 DIAGNOSIS — M25561 Pain in right knee: Secondary | ICD-10-CM | POA: Diagnosis not present

## 2020-05-21 DIAGNOSIS — M170 Bilateral primary osteoarthritis of knee: Secondary | ICD-10-CM | POA: Diagnosis not present

## 2020-05-27 ENCOUNTER — Other Ambulatory Visit: Payer: Self-pay | Admitting: Family Medicine

## 2020-05-27 ENCOUNTER — Ambulatory Visit
Admission: RE | Admit: 2020-05-27 | Discharge: 2020-05-27 | Disposition: A | Discharge status: Home / Self Care | Payer: 59 | Source: Ambulatory Visit | Attending: Family Medicine | Admitting: Family Medicine

## 2020-05-27 DIAGNOSIS — R0781 Pleurodynia: Secondary | ICD-10-CM

## 2020-05-27 DIAGNOSIS — R0789 Other chest pain: Secondary | ICD-10-CM | POA: Diagnosis not present

## 2020-06-03 ENCOUNTER — Other Ambulatory Visit: Payer: Self-pay | Admitting: Family Medicine

## 2020-06-03 DIAGNOSIS — R5381 Other malaise: Secondary | ICD-10-CM

## 2020-06-05 ENCOUNTER — Other Ambulatory Visit: Payer: Self-pay | Admitting: Family Medicine

## 2020-06-05 DIAGNOSIS — Z13820 Encounter for screening for osteoporosis: Secondary | ICD-10-CM

## 2020-06-05 DIAGNOSIS — E2839 Other primary ovarian failure: Secondary | ICD-10-CM

## 2020-06-11 ENCOUNTER — Ambulatory Visit
Admission: RE | Admit: 2020-06-11 | Discharge: 2020-06-11 | Disposition: A | Discharge status: Home / Self Care | Payer: 59 | Source: Ambulatory Visit | Attending: Family Medicine | Admitting: Family Medicine

## 2020-06-11 ENCOUNTER — Other Ambulatory Visit: Payer: Self-pay

## 2020-06-11 DIAGNOSIS — Z13820 Encounter for screening for osteoporosis: Secondary | ICD-10-CM

## 2020-06-11 DIAGNOSIS — E2839 Other primary ovarian failure: Secondary | ICD-10-CM

## 2020-06-11 NOTE — Progress Notes (Signed)
Name: Shelley Dixon  Age/ Sex: 59 y.o., female   MRN/ DOB: [id number], 10/27/1960     PCP: Gaynelle Arabian, MD   Reason for Endocrinology Evaluation: Type 2 Diabetes Mellitus  Initial Endocrine Consultative Visit: 11/08/2019    PATIENT IDENTIFIER: Shelley Dixon is a 59 y.o. female with a past medical history of T2Dm, HTN, CKD and Dyslipidemia . The patient has followed with Endocrinology clinic since 11/08/2019 for consultative assistance with management of her diabetes.  DIABETIC HISTORY:  Shelley Dixon was diagnosed with DM many years ago. She reports intolerance to invokana - yeast infections . Her hemoglobin A1c has ranged from 5.8% years ago , peaking at 10.9%  in 2021.  On her initial visit to our clinic she had an A1c of  10.9%  , she was on repaglinide, metformin, glimepiride and truclicity . We switched the repaglinide, and glimepiride to basal insulin, continued trulicity and metformin   Pt developed intolerance to Trulicity - vomiting and nausea. She already has reported intolerance of SGLT-2 inhibitors    SUBJECTIVE:   During the last visit (02/06/2020): A1c 9.1% . Continued lantus and Metformin   Today (06/12/2020): Shelley Dixon is here for a follow up on diabetes management.  She checks her blood sugars 1 times daily, preprandial to breakfast . The patient has not had hypoglycemic episodes since the last clinic visit.    HOME DIABETES REGIMEN:  Lantus 28 units daily  Metformin 500 mg XR 1 tablet with breakfast and 2 tablets with supper   Statin: yes ACE-I/ARB: yes    GLUCOSE LOG:  140-400 mg/dL      DIABETIC COMPLICATIONS: Microvascular complications:    Denies: retinopathy, neuropathy , CKD   Last eye exam: Completed 09/24/2019  Macrovascular complications:    Denies: CAD, PVD, CVA    HISTORY:  Past Medical History:  Past Medical History:  Diagnosis Date  . Diabetes mellitus without complication [facility])     Past Surgical  History: No past surgical history on file.  Social History:  reports that she has quit smoking. She has never used smokeless tobacco. She reports current alcohol use of about 2.0 standard drinks of alcohol per week. She reports that she does not use drugs.  Family History: No family history on file.   HOME MEDICATIONS: Allergies as of 06/12/2020      Reactions   Atorvastatin    Elevated LFT   Bydureon [exenatide]    Injection site pain   Pioglitazone    swelling   Simvastatin    ineffective      Medication List       Accurate as of June 12, 2020  7:32 AM. If you have any questions, ask your nurse or doctor.        Accu-Chek Aviva Plus test strip Generic drug: glucose blood 2x daily   Accu-Chek Aviva Plus w/Device Kit by Does not apply route.   ALPRAZolam 0.5 MG tablet Commonly known as: XANAX Take 0.25-0.5 mg by mouth 2 (two) times daily as needed.   atenolol 50 MG tablet Commonly known as: TENORMIN Take 100 mg by mouth daily.   cholecalciferol 25 MCG (1000 UNIT) tablet Commonly known as: VITAMIN D3 Take 1,000 Units by mouth daily.   Insulin Pen Needle 32G X 4 MM Misc 1 Device by Does not apply route daily.   Lantus SoloStar 100 UNIT/ML Solostar Pen Generic drug: insulin glargine Inject 28 Units into the skin daily.   lisinopril 10 MG tablet  Commonly known as: ZESTRIL lisinopril  twice a day   metFORMIN 500 MG 24 hr tablet Commonly known as: GLUCOPHAGE-XR TAKE 1 TABLET IN THE MORNING AND 2 TABLETS AT NIGHT WITH FOOD   NexIUM 40 MG capsule Generic drug: esomeprazole TAKE ONE CAPSULE BY MOUTH EVERY DAY FOR ACID REFLUX   pravastatin 20 MG tablet Commonly known as: PRAVACHOL Take 20 mg by mouth daily.   Vitamin B-12 1000 MCG/15ML Liqd Take by mouth.        OBJECTIVE:   Vital Signs: BP 122/80   Pulse 64   Wt 220 lb 12.8 oz (100.2 kg)   BMI 39.11 kg/m   Wt Readings from Last 3 Encounters:  06/12/20 220 lb 12.8 oz (100.2 kg)  02/06/20  217 lb 3.2 oz (98.5 kg)  12/07/19 217 lb (98.4 kg)     Exam: General: Pt appears well and is in NAD  Lungs: Clear with good BS bilat with no rales, rhonchi, or wheezes  Heart: RRR with normal S1 and S2 and no gallops; no murmurs; no rub  Abdomen: Normoactive bowel sounds, soft, nontende  Extremities: Trace pretibial edema.  Skin: Normal texture and temperature to palpation. No rash noted. No Acanthosis nigricans/skin tags. No lipohypertrophy.  Neuro: MS is good with appropriate affect, pt is alert and Ox3    DM foot exam: 11/07/2019  The skin of the feet is intact without sores or ulcerations. The pedal pulses are 2+ on right and 2+ on left. The sensation is intact to a screening 5.07, 10 gram monofilament bilaterally   DATA REVIEWED:  04/16/2020 BUN/Cr 21/0.93 GFR 62 Tg 250 HDL 38 LDL 111 Alk phos 59 AST 15 Alt 21   10/04/2019  LDL 89 mg/dL TG 276 A1c 10.9% Urine micro/Alb ratio 17.8 BUN/Cr 17/0.94  ASSESSMENT / PLAN / RECOMMENDATIONS:   1) Type 2 Diabetes Mellitus, Poorly controlled, Without complications - Most recent A1c of 9.3 %. Goal A1c < 7.0 %.  -Pt continues with hyperglycemia , this is mainly noted after supper. 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  - She is intolerant to pioglitazone - If she is unable to tolerate Wilder Glade, will consider adding a prandial insulin with supper    MEDICATIONS:  - Continue Metfomrin 1 tablet in the morning and 2 tablets with Supper - Continue Lantus 28 units daily  - Start Farxiga 5 mg daily    EDUCATION / INSTRUCTIONS:  BG monitoring instructions: Patient is instructed to check her blood sugars 1 times a day,  fasting  Call [facility] if: BG persistently < 70  . I  reviewed the Rule of 15 for the treatment of hypoglycemia in detail with the patient. Literature supplied.    2) Diabetic complications:   Eye: Does not have known diabetic retinopathy.   Neuro/ Feet: Does not have known diabetic peripheral neuropathy .   Renal: Patient does not have known baseline CKD. She   is on an ACEI/ARB at present.   3) Dyslipidemia :  - LDL trended up from 89 mg/dL to 111 mg/dL in 04/2020. She has Hx of elevated LFT's to Atorvastatin. She is intolerant to Simvastatin as well. Will stop Pravastatin and start her on small dose of Crestor. LFT's were normal in 04/2020. Will monitor  this while on crestor    Medications STOP Pravastatin Start Rosuvastatin 5 mg daily       F/U in 4 months    Signed electronically by: Mack Guise, MD  [facility] Endocrin[facility]  [facility] [address] [place], [place] [address] Phone: [phone] FAX: [phone]   CC: Gaynelle Arabian, MD [address] [place] [address] Phone: [phone]  Fax: [phone]  Return to Endocrinology clinic as below: No future appointments.

## 2020-06-12 ENCOUNTER — Encounter: Payer: Self-pay | Admitting: Internal Medicine

## 2020-06-12 ENCOUNTER — Ambulatory Visit (INDEPENDENT_AMBULATORY_CARE_PROVIDER_SITE_OTHER): Payer: 59 | Admitting: Internal Medicine

## 2020-06-12 VITALS — BP 122/80 | HR 64 | Wt 220.8 lb

## 2020-06-12 DIAGNOSIS — E785 Hyperlipidemia, unspecified: Secondary | ICD-10-CM | POA: Diagnosis not present

## 2020-06-12 DIAGNOSIS — E1165 Type 2 diabetes mellitus with hyperglycemia: Secondary | ICD-10-CM

## 2020-06-12 MED ORDER — METFORMIN HCL ER 500 MG PO TB24
1000.0000 mg | ORAL_TABLET | Freq: Two times a day (BID) | ORAL | 3 refills | Status: DC
Start: 2020-06-12 — End: 2021-02-18

## 2020-06-12 MED ORDER — ROSUVASTATIN CALCIUM 5 MG PO TABS: 5.0000 mg | ORAL_TABLET | Freq: Every day | ORAL | 3 refills | Status: DC

## 2020-06-12 MED ORDER — DAPAGLIFLOZIN PROPANEDIOL 5 MG PO TABS: 5.0000 mg | ORAL_TABLET | Freq: Every day | ORAL | 6 refills | Status: DC

## 2020-06-12 NOTE — Patient Instructions (Addendum)
-   Start Farxiga 5 mg daily with breakfast  - Increase Metformin 500 mg, 2 tablets with breakfast and 2 tablet with supper - Continue Lantus 28 units daily     - STOP Pravastatin - Start Crestor 5 mg daily    You can start over the counter probiotics and cranberry tablets     HOW TO TREAT LOW BLOOD SUGARS (Blood sugar LESS THAN 70 MG/DL)  Please follow the RULE OF 15 for the treatment of hypoglycemia treatment (when your (blood sugars are less than 70 mg/dL)    STEP 1: Take 15 grams of carbohydrates when your blood sugar is low, which includes:   3-4 GLUCOSE TABS  OR  3-4 OZ OF JUICE OR REGULAR SODA OR  ONE TUBE OF GLUCOSE GEL     STEP 2: RECHECK blood sugar in 15 MINUTES STEP 3: If your blood sugar is still low at the 15 minute recheck --> then, go back to STEP 1 and treat AGAIN with another 15 grams of carbohydrates.

## 2020-06-13 ENCOUNTER — Telehealth: Payer: Self-pay

## 2020-06-13 NOTE — Telephone Encounter (Signed)
PA initiated via [email or web address] for Farxiga 5mg  daily.   Girard Cooter Key: [id number] - PA Case ID: [id number] Need help? Call us at [phone] Status Sent to [place] 5MG  tablets Form [employer] Electronic Prior Authorization Form [phone] NCP[place])

## 2020-06-13 NOTE — Telephone Encounter (Signed)
Would you like to do PA or change medication?

## 2020-06-13 NOTE — Telephone Encounter (Signed)
-----   Message from [place], [place] sent at 06/13/2020 11:32 AM EDT ----- Regarding: PA Wilder Glade Received PA request for Farxiga 5mg  from [facility] for this Pt.  They have not started a key in Covermymeds.

## 2020-06-16 ENCOUNTER — Other Ambulatory Visit: Payer: Self-pay | Admitting: Internal Medicine

## 2020-06-16 ENCOUNTER — Encounter: Payer: Self-pay | Admitting: Internal Medicine

## 2020-06-16 MED ORDER — EMPAGLIFLOZIN 10 MG PO TABS: 10.0000 mg | ORAL_TABLET | Freq: Every day | ORAL | 6 refills | Status: DC

## 2020-06-16 NOTE — Telephone Encounter (Signed)
Received fax from [employer] stating that the PA has been denied for Shelley Dixon because the medication is not a covered benefit and it is excluded from coverage in accordance with the terms and conditions of the pt's plan benefits.   How would you like to proceed?

## 2020-06-17 NOTE — Telephone Encounter (Signed)
Noted. Will notify pt via mychart.

## 2020-07-24 DIAGNOSIS — D485 Neoplasm of uncertain behavior of skin: Secondary | ICD-10-CM | POA: Diagnosis not present

## 2020-08-01 ENCOUNTER — Other Ambulatory Visit: Payer: Self-pay | Admitting: Internal Medicine

## 2020-08-19 DIAGNOSIS — C44329 Squamous cell carcinoma of skin of other parts of face: Secondary | ICD-10-CM | POA: Diagnosis not present

## 2020-09-24 LAB — HM DIABETES EYE EXAM

## 2020-09-30 ENCOUNTER — Encounter: Payer: Self-pay | Admitting: *Deleted

## 2020-10-14 ENCOUNTER — Other Ambulatory Visit: Payer: Self-pay

## 2020-10-15 NOTE — Progress Notes (Signed)
Name: Shelley Dixon  Age/ Sex: 60 y.o., female   MRN/ DOB: [id number], 13-Feb-1961     PCP: Blair Heys, MD   Reason for Endocrinology Evaluation: Type 2 Diabetes Mellitus  Initial Endocrine Consultative Visit: 11/08/2019      PATIENT IDENTIFIER: [name] is a 60 y.o. female with a past medical history of T2Dm, HTN, CKD and Dyslipidemia . The patient has followed with Endocrinology clinic since 11/08/2019 for consultative assistance with management of her diabetes.  DIABETIC HISTORY:  Shelley Dixon was diagnosed with DM many years ago. She reports intolerance to invokana - yeast infections. She recalls developing swelling  to Actos may have been swelling . Her hemoglobin A1c has ranged from 5.8% years ago , peaking at 10.9%  in 2021.  On her initial visit to our clinic she had an A1c of  10.9%  , she was on repaglinide, metformin, glimepiride and truclicity . We switched the repaglinide, and glimepiride to basal insulin, continued trulicity and metformin   Pt developed intolerance to Trulicity - vomiting and nausea. She already has reported intolerance of SGLT-2 inhibitors   Jardiance started 06/2020  SUBJECTIVE:   During the last visit (06/12/2020): A1c 9.3% . Continued lantus and Metformin and started jardiance     Today (10/16/2020): Shelley Dixon is here for a follow up on diabetes management.  She checks her blood sugars 1 times daily, preprandial to breakfast . The patient has not had hypoglycemic episodes since the last clinic visit.   Denies nausea or diarrhea  A month ago she had a yeast infection.    Has noted occasional burning  with the feet        HOME DIABETES REGIMEN:  Lantus 28 units daily  Metformin 500 mg XR 2 tablet with breakfast and 2 tablets with supper Jardiance 10 mg daily      Statin: yes ACE-I/ARB: yes   METER DOWNLOAD SUMMARY: 12/30-1/13/2022  Average Number Tests/Day = 1.1 Overall Mean FS Glucose = 201 Standard  Deviation = 60  BG Ranges: Low = 131 High = 291   Hypoglycemic Events/30 Days: BG < 50 = 0 Episodes of symptomatic severe hypoglycemia = 0      DIABETIC COMPLICATIONS: Microvascular complications:    Denies: retinopathy, neuropathy , CKD   Last eye exam: Completed 09/24/2020  Macrovascular complications:    Denies: CAD, PVD, CVA    HISTORY:  Past Medical History:  Past Medical History:  Diagnosis Date  . Diabetes mellitus without complication [facility])     Past Surgical History: No past surgical history on file.  Social History:  reports that she has quit smoking. She has never used smokeless tobacco. She reports current alcohol use of about 2.0 standard drinks of alcohol per week. She reports that she does not use drugs.  Family History: No family history on file.   HOME MEDICATIONS: Allergies as of 10/16/2020      Reactions   Atorvastatin    Elevated LFT   Bydureon [exenatide]    Injection site pain   Pioglitazone    swelling   Simvastatin    ineffective      Medication List       Accurate as of October 16, 2020  7:51 AM. If you have any questions, ask your nurse or doctor.        Accu-Chek Aviva Plus test strip Generic drug: glucose blood 2x daily   Accu-Chek Aviva Plus w/Device Kit by Does not apply route.  ALPRAZolam 0.5 MG tablet Commonly known as: XANAX Take 0.25-0.5 mg by mouth 2 (two) times daily as needed.   atenolol 50 MG tablet Commonly known as: TENORMIN Take 100 mg by mouth daily.   cholecalciferol 25 MCG (1000 UNIT) tablet Commonly known as: VITAMIN D3 Take 1,000 Units by mouth daily.   empagliflozin 10 MG Tabs tablet Commonly known as: Jardiance Take 1 tablet (10 mg total) by mouth daily before breakfast.   esomeprazole 40 MG capsule Commonly known as: NEXIUM TAKE ONE CAPSULE BY MOUTH EVERY DAY FOR ACID REFLUX   Insulin Pen Needle 32G X 4 MM Misc 1 Device by Does not apply route daily.   Lantus SoloStar 100  UNIT/ML Solostar Pen Generic drug: insulin glargine INJECT 28 UNITS INTO THE SKIN DAILY.   lisinopril 10 MG tablet Commonly known as: ZESTRIL lisinopril  twice a day   metFORMIN 500 MG 24 hr tablet Commonly known as: GLUCOPHAGE-XR Take 2 tablets (1,000 mg total) by mouth in the morning and at bedtime.   rosuvastatin 5 MG tablet Commonly known as: Crestor Take 1 tablet (5 mg total) by mouth daily.   Vitamin B-12 1000 MCG/15ML Liqd Take by mouth.         OBJECTIVE:   Vital Signs: BP 120/78   Pulse 67   Ht 5\' 3"  (1.6 m)   Wt 220 lb (99.8 kg)   SpO2 99%   BMI 38.97 kg/m   Wt Readings from Last 3 Encounters:  10/16/20 220 lb (99.8 kg)  06/12/20 220 lb 12.8 oz (100.2 kg)  02/06/20 217 lb 3.2 oz (98.5 kg)     Exam: General: Pt appears well and is in NAD  Lungs: Clear with good BS bilat with no rales, rhonchi, or wheezes  Heart: RRR   Abdomen: Normoactive bowel sounds, soft, nontende  Extremities: Trace pretibial edema.  Neuro: MS is good with appropriate affect, pt is alert and Ox3    DM foot exam: 10/16/2020  The skin of the feet is intact without sores or ulcerations. The pedal pulses are 2+ on right and 2+ on left. The sensation is intact to a screening 5.07, 10 gram monofilament bilaterally    DATA REVIEWED:  04/16/2020 BUN/Cr 21/0.93 GFR 62 Tg 250 HDL 38 LDL 111 Alk phos 59 AST 15 Alt 21   10/04/2019  LDL 89 mg/dL TG 962 X5M 84.1% Urine micro/Alb ratio 17.8 BUN/Cr 17/0.94  ASSESSMENT / PLAN / RECOMMENDATIONS:   1) Type 2 Diabetes Mellitus, with Improving glycemic control, Without complications - Most recent A1c of 8.4 %. Goal A1c < 7.0 %.   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 - She developed severe nausea and vomiting to Trulicity.  - She is intolerant to  pioglitazone - Will add Januvia, as she has no reported intolerance to it in the past     MEDICATIONS:  - Continue Metfomrin 2 tablet in the morning and 2 tablets with Supper - Continue Lantus 28 units daily  - Continue Jardiance 10 mg daily   - Start Januvia 100 mg daily     EDUCATION / INSTRUCTIONS:  BG monitoring instructions: Patient is instructed to check her blood sugars 1 times a day,  fasting  Call [place] [facility] if: BG persistently < 70  . I reviewed the Rule  of 15 for the treatment of hypoglycemia in detail with the patient. Literature supplied.    2) Diabetic complications:   Eye: Does not have known diabetic retinopathy.   Neuro/ Feet: Does not have known diabetic peripheral neuropathy .   Renal: Patient does not have known baseline CKD. She   is on an ACEI/ARB at present.      3) Dyslipidemia :  - LDL trended up from 89 mg/dL to 191 mg/dL in 01/7828. She has Hx of elevated LFT's to Atorvastatin. She is intolerant to Simvastatin as well. We stopped  Pravastatin and started her on small dose of Crestor  By 06/2020.   - Lipid profile today shows LDL at goal and Tg is trending down   Medications   Continue Rosuvastatin 5 mg daily       F/U in 4 months    Signed electronically by: Lyndle Herrlich, MD  [facility]  [facility] [address] [place] [place], [place] [address] Phone: [phone] FAX: [phone]   CC: Blair Heys, MD [address] [place] [address] Phone: [phone]  Fax: [phone]  Return to Endocrinology clinic as below: No future appointments.

## 2020-10-16 ENCOUNTER — Other Ambulatory Visit: Payer: Self-pay | Admitting: Internal Medicine

## 2020-10-16 ENCOUNTER — Encounter: Payer: Self-pay | Admitting: Internal Medicine

## 2020-10-16 ENCOUNTER — Ambulatory Visit (INDEPENDENT_AMBULATORY_CARE_PROVIDER_SITE_OTHER): Payer: 59 | Admitting: Internal Medicine

## 2020-10-16 ENCOUNTER — Other Ambulatory Visit: Payer: Self-pay

## 2020-10-16 VITALS — BP 120/78 | HR 67 | Ht 63.0 in | Wt 220.0 lb

## 2020-10-16 DIAGNOSIS — E1165 Type 2 diabetes mellitus with hyperglycemia: Secondary | ICD-10-CM | POA: Diagnosis not present

## 2020-10-16 DIAGNOSIS — E785 Hyperlipidemia, unspecified: Secondary | ICD-10-CM | POA: Diagnosis not present

## 2020-10-16 LAB — LIPID PANEL
Cholesterol: 132 mg/dL (ref 0–200)
HDL: 39.8 mg/dL (ref >39.00)
LDL Cholesterol: 53 mg/dL (ref 0–99)
NonHDL: 91.96
Total CHOL/HDL Ratio: 3
Triglycerides: 197 mg/dL — ABNORMAL HIGH (ref 0.0–149.0)
VLDL: 39.4 mg/dL (ref 0.0–40.0)

## 2020-10-16 LAB — COMPREHENSIVE METABOLIC PANEL
ALT: 18 U/L (ref 0–35)
AST: 17 U/L (ref 0–37)
Albumin: 4.6 g/dL (ref 3.5–5.2)
Alkaline Phosphatase: 47 U/L (ref 39–117)
BUN: 25 mg/dL — ABNORMAL HIGH (ref 6–23)
CO2: 27 mEq/L (ref 19–32)
Calcium: 10.7 mg/dL — ABNORMAL HIGH (ref 8.4–10.5)
Chloride: 101 mEq/L (ref 96–112)
Creatinine, Ser: 1.07 mg/dL (ref 0.40–1.20)
GFR: 57.19 mL/min — ABNORMAL LOW (ref >60.00)
Glucose, Bld: 146 mg/dL — ABNORMAL HIGH (ref 70–99)
Potassium: 4 mEq/L (ref 3.5–5.1)
Sodium: 136 mEq/L (ref 135–145)
Total Bilirubin: 0.6 mg/dL (ref 0.2–1.2)
Total Protein: 7.8 g/dL (ref 6.0–8.3)

## 2020-10-16 LAB — POCT GLYCOSYLATED HEMOGLOBIN (HGB A1C): Hemoglobin A1C: 8.4 % — AB (ref 4.0–5.6)

## 2020-10-16 LAB — MICROALBUMIN / CREATININE URINE RATIO
Creatinine,U: 93.3 mg/dL
Microalb Creat Ratio: 1.3 mg/g (ref 0.0–30.0)
Microalb, Ur: 1.2 mg/dL (ref 0.0–1.9)

## 2020-10-16 MED ORDER — SITAGLIPTIN PHOSPHATE 100 MG PO TABS: 100.0000 mg | ORAL_TABLET | Freq: Every day | ORAL | 3 refills | Status: DC

## 2020-10-16 NOTE — Patient Instructions (Signed)
-   Continue Jardiance 10 mg daily with breakfast  - Continue Metformin 500 mg, 2 tablets with breakfast and 2 tablet with supper - Continue Lantus 28 units daily  - Start Januvia 100 mg daily        HOW TO TREAT LOW BLOOD SUGARS (Blood sugar LESS THAN 70 MG/DL)  Please follow the RULE OF 15 for the treatment of hypoglycemia treatment (when your (blood sugars are less than 70 mg/dL)    STEP 1: Take 15 grams of carbohydrates when your blood sugar is low, which includes:   3-4 GLUCOSE TABS  OR  3-4 OZ OF JUICE OR REGULAR SODA OR  ONE TUBE OF GLUCOSE GEL     STEP 2: RECHECK blood sugar in 15 MINUTES STEP 3: If your blood sugar is still low at the 15 minute recheck --> then, go back to STEP 1 and treat AGAIN with another 15 grams of carbohydrates.

## 2020-10-16 NOTE — Telephone Encounter (Signed)
Please advise 

## 2020-10-21 DIAGNOSIS — E7800 Pure hypercholesterolemia, unspecified: Secondary | ICD-10-CM | POA: Diagnosis not present

## 2020-10-21 DIAGNOSIS — N183 Chronic kidney disease, stage 3 unspecified: Secondary | ICD-10-CM | POA: Diagnosis not present

## 2020-10-21 DIAGNOSIS — E1169 Type 2 diabetes mellitus with other specified complication: Secondary | ICD-10-CM | POA: Diagnosis not present

## 2020-10-21 DIAGNOSIS — I129 Hypertensive chronic kidney disease with stage 1 through stage 4 chronic kidney disease, or unspecified chronic kidney disease: Secondary | ICD-10-CM | POA: Diagnosis not present

## 2020-10-22 ENCOUNTER — Encounter: Payer: Self-pay | Admitting: Internal Medicine

## 2020-11-10 DIAGNOSIS — H2513 Age-related nuclear cataract, bilateral: Secondary | ICD-10-CM | POA: Diagnosis not present

## 2020-11-10 DIAGNOSIS — H25043 Posterior subcapsular polar age-related cataract, bilateral: Secondary | ICD-10-CM | POA: Diagnosis not present

## 2020-11-10 DIAGNOSIS — E119 Type 2 diabetes mellitus without complications: Secondary | ICD-10-CM | POA: Diagnosis not present

## 2020-11-10 DIAGNOSIS — H43813 Vitreous degeneration, bilateral: Secondary | ICD-10-CM | POA: Diagnosis not present

## 2020-11-27 DIAGNOSIS — H25811 Combined forms of age-related cataract, right eye: Secondary | ICD-10-CM | POA: Diagnosis not present

## 2020-11-27 DIAGNOSIS — H25041 Posterior subcapsular polar age-related cataract, right eye: Secondary | ICD-10-CM | POA: Diagnosis not present

## 2020-11-27 DIAGNOSIS — H25011 Cortical age-related cataract, right eye: Secondary | ICD-10-CM | POA: Diagnosis not present

## 2020-12-01 IMAGING — CR DG RIBS W/ CHEST 3+V*R*
2 series · 2 of 2 positions shown · non-contrast
Comparison: None.

CLINICAL DATA: Right-sided rib pain for 3 weeks, no known injury,
initial encounter

EXAM:
RIGHT RIBS AND CHEST - 3+ VIEW

[w ribs ap/pa upper right *]
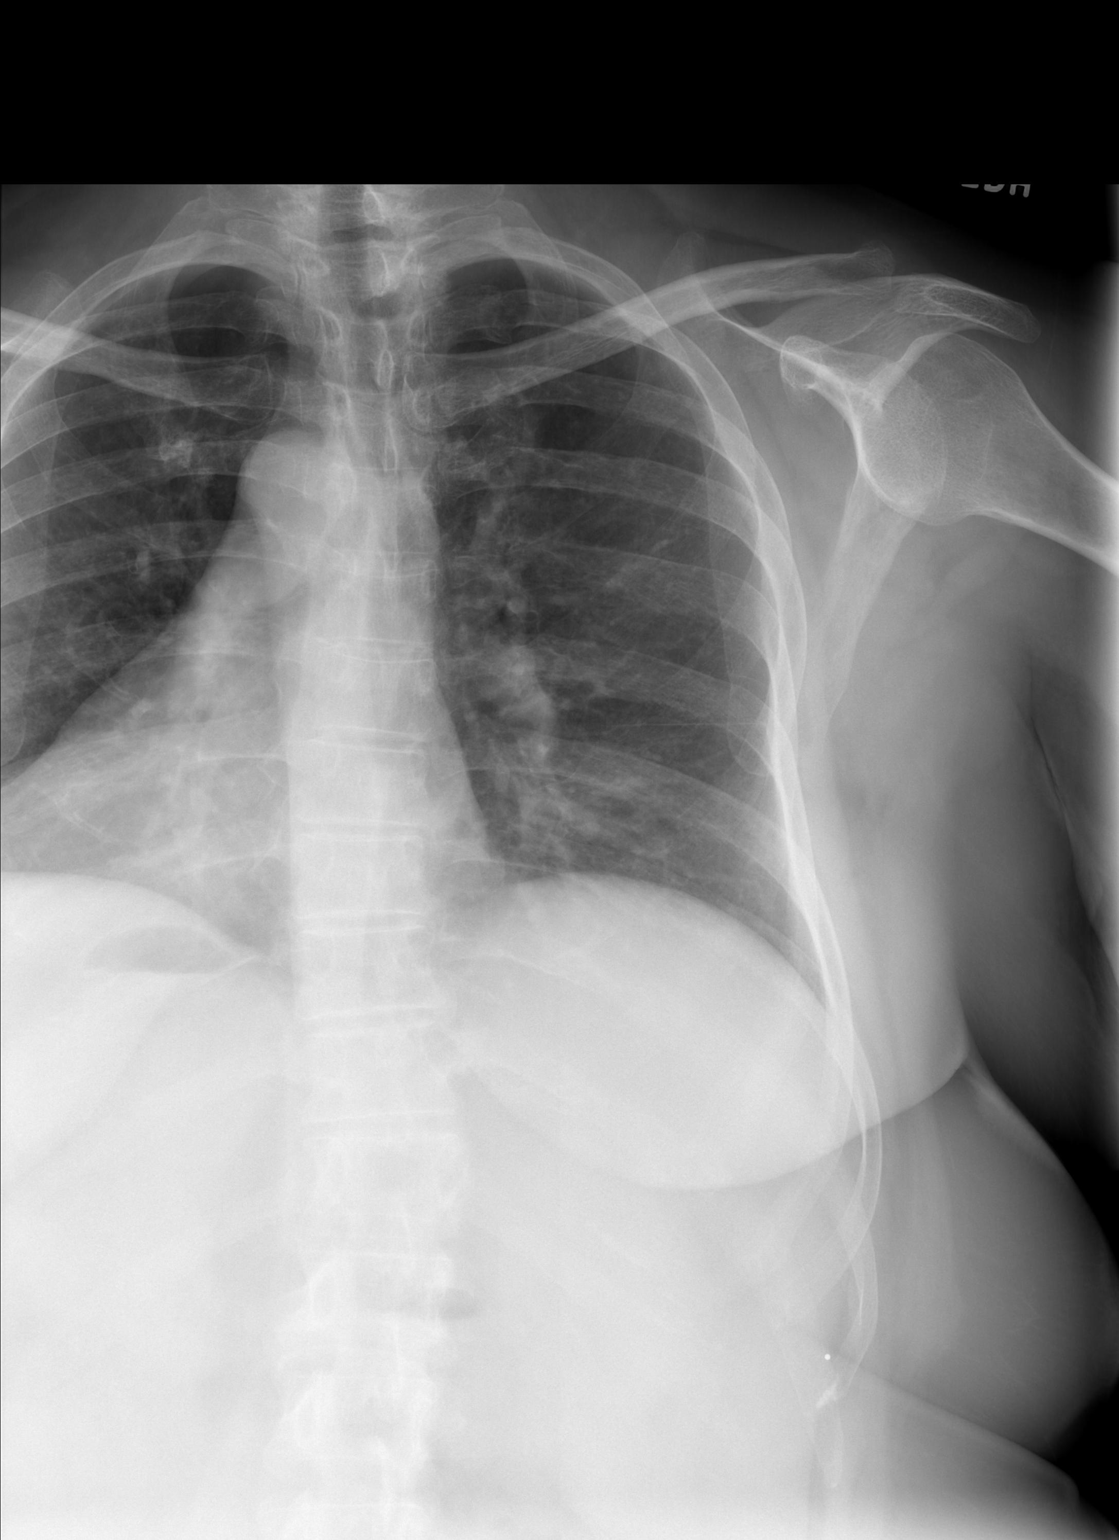

[w ribs oblique right *]
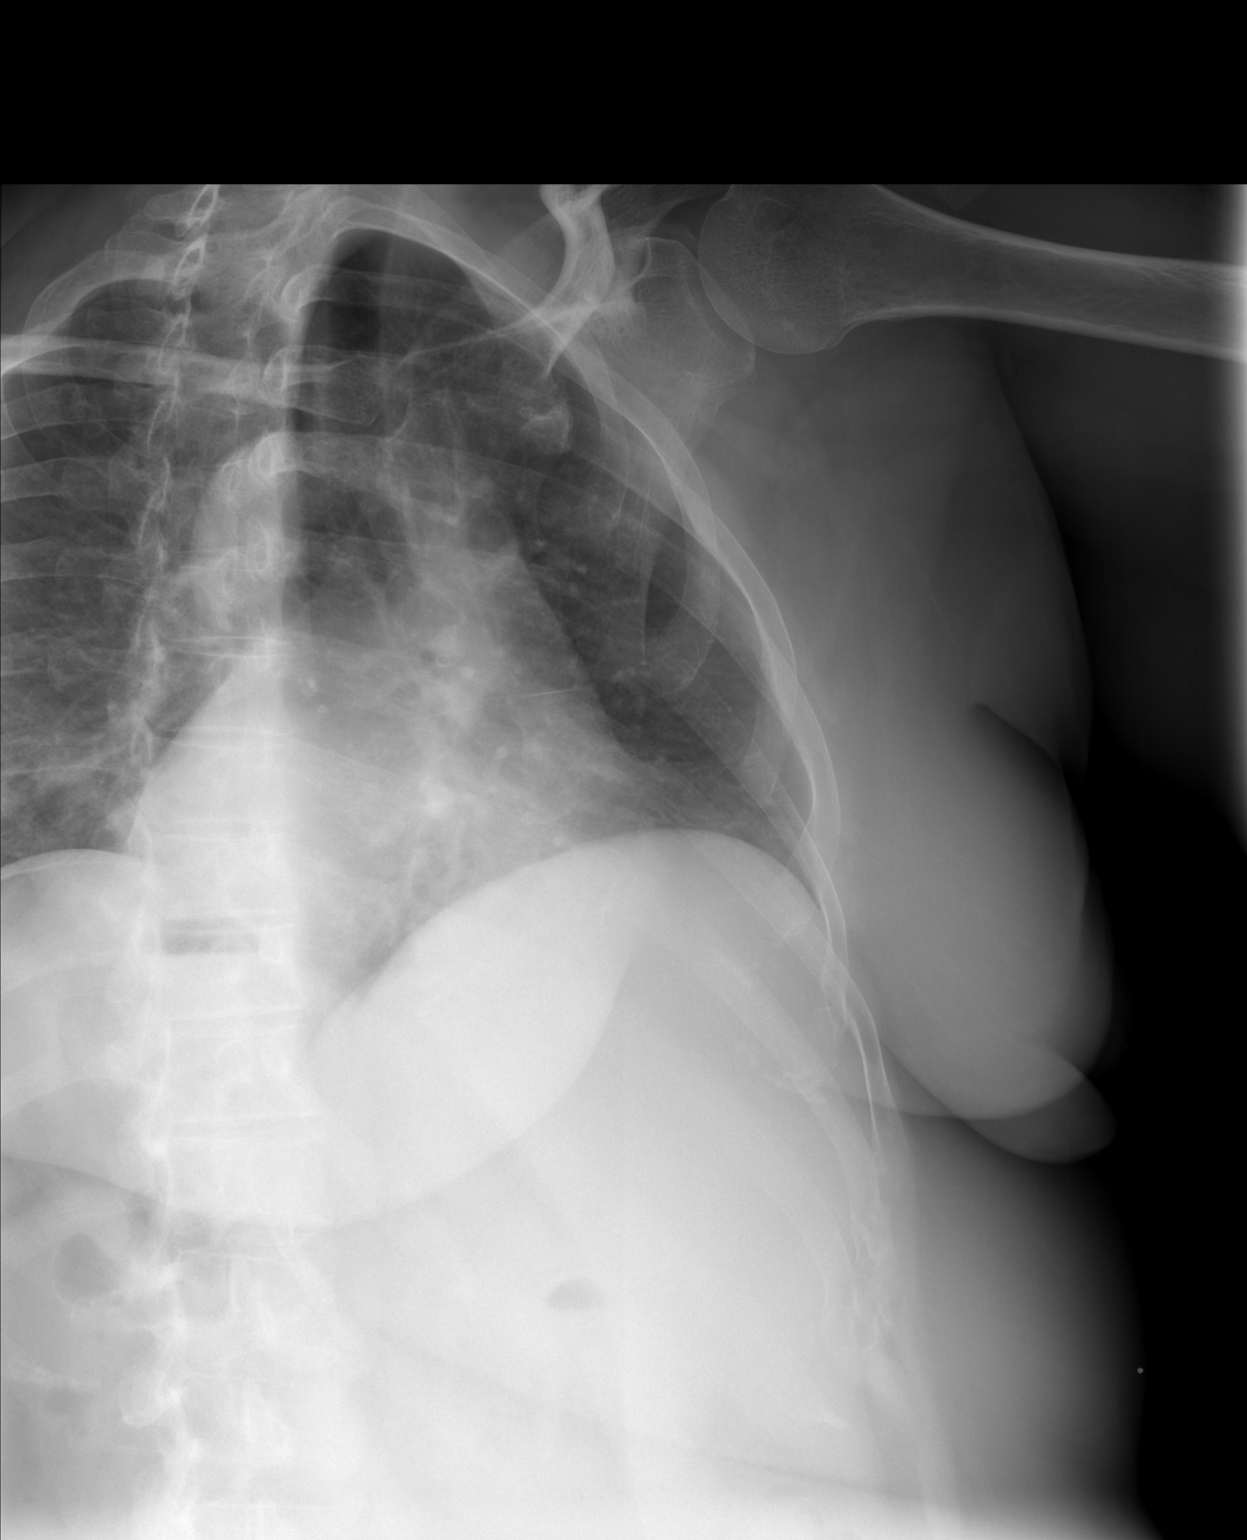

[2 of 2 positions shown; findings below may reference images not displayed]

FINDINGS: Cardiac shadow is within normal limits. The lungs are clear
bilaterally. Mild irregularity of the right eighth rib is noted
anterior laterally consistent with an undisplaced fracture. Similar
findings are noted involving the ninth rib. No pneumothorax is seen.
No other focal abnormality is noted.
IMPRESSION: Undisplaced fractures of the right eighth and ninth ribs.

## 2020-12-11 DIAGNOSIS — H2512 Age-related nuclear cataract, left eye: Secondary | ICD-10-CM | POA: Diagnosis not present

## 2020-12-11 DIAGNOSIS — H25012 Cortical age-related cataract, left eye: Secondary | ICD-10-CM | POA: Diagnosis not present

## 2020-12-11 DIAGNOSIS — H25812 Combined forms of age-related cataract, left eye: Secondary | ICD-10-CM | POA: Diagnosis not present

## 2020-12-11 DIAGNOSIS — H25042 Posterior subcapsular polar age-related cataract, left eye: Secondary | ICD-10-CM | POA: Diagnosis not present

## 2021-01-07 ENCOUNTER — Other Ambulatory Visit: Payer: Self-pay | Admitting: Internal Medicine

## 2021-02-07 ENCOUNTER — Other Ambulatory Visit: Payer: Self-pay | Admitting: Internal Medicine

## 2021-02-17 ENCOUNTER — Other Ambulatory Visit: Payer: Self-pay | Admitting: Internal Medicine

## 2021-02-18 ENCOUNTER — Encounter: Payer: Self-pay | Admitting: Internal Medicine

## 2021-02-18 ENCOUNTER — Ambulatory Visit: Payer: 59 | Admitting: Internal Medicine

## 2021-02-18 ENCOUNTER — Other Ambulatory Visit: Payer: Self-pay

## 2021-02-18 VITALS — BP 122/80 | HR 80 | Ht 63.0 in | Wt 225.1 lb

## 2021-02-18 DIAGNOSIS — E1165 Type 2 diabetes mellitus with hyperglycemia: Secondary | ICD-10-CM

## 2021-02-18 DIAGNOSIS — E785 Hyperlipidemia, unspecified: Secondary | ICD-10-CM | POA: Diagnosis not present

## 2021-02-18 LAB — POCT GLYCOSYLATED HEMOGLOBIN (HGB A1C): Hemoglobin A1C: 8.9 % — AB (ref 4.0–5.6)

## 2021-02-18 MED ORDER — DEXCOM G6 TRANSMITTER MISC: 1.0000 | 3 refills | Status: DC

## 2021-02-18 MED ORDER — LANTUS SOLOSTAR 100 UNIT/ML ~~LOC~~ SOPN: 32.0000 [IU] | PEN_INJECTOR | Freq: Every day | SUBCUTANEOUS | 3 refills | Status: DC

## 2021-02-18 MED ORDER — DEXCOM G6 SENSOR MISC: 1.0000 | 3 refills | Status: DC

## 2021-02-18 MED ORDER — METFORMIN HCL ER 500 MG PO TB24: 500.0000 mg | ORAL_TABLET | Freq: Two times a day (BID) | ORAL | 3 refills | Status: DC

## 2021-02-18 MED ORDER — ALOGLIPTIN BENZOATE 25 MG PO TABS: 25.0000 mg | ORAL_TABLET | Freq: Every day | ORAL | 3 refills | Status: DC

## 2021-02-18 MED ORDER — EMPAGLIFLOZIN 25 MG PO TABS: 25.0000 mg | ORAL_TABLET | Freq: Every day | ORAL | 3 refills | Status: DC

## 2021-02-18 NOTE — Progress Notes (Signed)
Name: Shelley Dixon  Age/ Sex: 60 y.o., female   MRN/ DOB: [id number], 11-20-1960     PCP: Gaynelle Arabian, MD   Reason for Endocrinology Evaluation: Type 2 Diabetes Mellitus  Initial Endocrine Consultative Visit: 11/08/2019      PATIENT IDENTIFIER: Shelley Dixon is a 60 y.o. female with a past medical history of T2Dm, HTN, CKD and Dyslipidemia . The patient has followed with Endocrinology clinic since 11/08/2019 for consultative assistance with management of her diabetes.  DIABETIC HISTORY:  [name] was diagnosed with DM many years ago. She reports intolerance to invokana - yeast infections. She recalls developing swelling  to Actos may have been swelling . Her hemoglobin A1c has ranged from 5.8% years ago , peaking at 10.9%  in 2021.  On her initial visit to our clinic she had an A1c of  10.9%  , she was on repaglinide, metformin, glimepiride and truclicity . We switched the repaglinide, and glimepiride to basal insulin, continued trulicity and metformin   Pt developed intolerance to Trulicity - vomiting and nausea. She already has reported intolerance of SGLT-2 inhibitors   Jardiance started 06/2020   Decreased Metformin due to diarrhea 02/2021  SUBJECTIVE:   During the last visit (10/16/2020): A1c 8.4 % . Continued lantus and Metformin and started Januvia     Today (02/18/2021): [name] is here for a follow up on diabetes management.  She checks her blood sugars occasionally, preprandial to breakfast . The patient has not had hypoglycemic episodes since the last clinic visit.   Denies nausea but has chronic diarrhea up to 3x a day, she attributes this to Metformin   Januvia was cost prohibitive, switched to Alogliptin but she is not able to get it.  Unable to exercise due to knee pains, unable to proceed with surgery until A1c < 7.5 %   HOME DIABETES REGIMEN:  Lantus 28 units daily  Metformin 500 mg XR 2 tablet with breakfast and 2 tablets with  supper Jardiance 10 mg daily  Januvia 100 mg daily - not taking      Statin: yes ACE-I/ARB: yes   METER DOWNLOAD SUMMARY: 5/4-5/18/2022  Average Number Tests/Day = 0.2 Overall Mean FS Glucose = 173 Standard Deviation = 24  BG Ranges: Low = 154 High = 200   Hypoglycemic Events/30 Days: BG < 50 = 0 Episodes of symptomatic severe hypoglycemia = 0      DIABETIC COMPLICATIONS: Microvascular complications:    Denies: retinopathy, neuropathy , CKD   Last eye exam: Completed 09/24/2020  Macrovascular complications:    Denies: CAD, PVD, CVA    HISTORY:  Past Medical History:  Past Medical History:  Diagnosis Date  . Diabetes mellitus without complication [facility])     Past Surgical History: No past surgical history on file.  Social History:  reports that she has quit smoking. She has never used smokeless tobacco. She reports current alcohol use of about 2.0 standard drinks of alcohol per week. She reports that she does not use drugs.  Family History: No family history on file.   HOME MEDICATIONS: Allergies as of 02/18/2021      Reactions   Atorvastatin    Elevated LFT   Bydureon [exenatide]    Injection site pain   Pioglitazone    swelling   Simvastatin    ineffective      Medication List       Accurate as of Feb 18, 2021  7:59 AM. If you have any  questions, ask your nurse or doctor.        Accu-Chek Aviva Plus test strip Generic drug: glucose blood 2x daily   Accu-Chek Aviva Plus w/Device Kit by Does not apply route.   Alogliptin Benzoate 25 MG Tabs Take 25 mg by mouth daily.   ALPRAZolam 0.5 MG tablet Commonly known as: XANAX Take 0.25-0.5 mg by mouth 2 (two) times daily as needed.   atenolol 50 MG tablet Commonly known as: TENORMIN Take 100 mg by mouth daily.   BD Pen Needle Nano 2nd Gen 32G X 4 MM Misc Generic drug: Insulin Pen Needle Use as direct to inject insulin daily   cholecalciferol 25 MCG (1000 UNIT)  tablet Commonly known as: VITAMIN D3 Take 1,000 Units by mouth daily.   Dexcom G6 Sensor Misc 1 Device by Does not apply route as directed. Started by: Dorita Sciara, MD   Dexcom G6 Transmitter Misc 1 Device by Does not apply route as directed. Started by: Dorita Sciara, MD   empagliflozin 25 MG Tabs tablet Commonly known as: Jardiance Take 1 tablet (25 mg total) by mouth daily before breakfast. What changed:   medication strength  how much to take Changed by: Dorita Sciara, MD   esomeprazole 40 MG capsule Commonly known as: NEXIUM TAKE ONE CAPSULE BY MOUTH EVERY DAY FOR ACID REFLUX   Lantus SoloStar 100 UNIT/ML Solostar Pen Generic drug: insulin glargine Inject 32 Units into the skin daily. What changed: how much to take Changed by: Dorita Sciara, MD   lisinopril 10 MG tablet Commonly known as: ZESTRIL lisinopril  twice a day   metFORMIN 500 MG 24 hr tablet Commonly known as: GLUCOPHAGE-XR Take 1 tablet (500 mg total) by mouth in the morning and at bedtime. What changed: how much to take Changed by: Dorita Sciara, MD   rosuvastatin 5 MG tablet Commonly known as: Crestor Take 1 tablet (5 mg total) by mouth daily.   Vitamin B-12 1000 MCG/15ML Liqd Take by mouth.         OBJECTIVE:   Vital Signs: BP 122/80   Pulse 80   Ht 5' 3"  (1.6 m)   Wt 225 lb 2 oz (102.1 kg)   SpO2 98%   BMI 39.88 kg/m   Wt Readings from Last 3 Encounters:  02/18/21 225 lb 2 oz (102.1 kg)  10/16/20 220 lb (99.8 kg)  06/12/20 220 lb 12.8 oz (100.2 kg)     Exam: General: Pt appears well and is in NAD  Lungs: Clear with good BS bilat with no rales, rhonchi, or wheezes  Heart: RRR   Abdomen: Normoactive bowel sounds, soft, nontende  Extremities: Trace pretibial edema.  Neuro: MS is good with appropriate affect, pt is alert and Ox3    DM foot exam: 10/16/2020  The skin of the feet is intact without sores or ulcerations. The pedal pulses  are 2+ on right and 2+ on left. The sensation is intact to a screening 5.07, 10 gram monofilament bilaterally    DATA REVIEWED:  04/16/2020 BUN/Cr 21/0.93 GFR 62 Tg 250 HDL 38 LDL 111 Alk phos 59 AST 15 Alt 21   10/04/2019  LDL 89 mg/dL TG 276 A1c 10.9% Urine micro/Alb ratio 17.8 BUN/Cr 17/0.94  ASSESSMENT / PLAN / RECOMMENDATIONS:   1) Type 2 Diabetes Mellitus, Poorly  controlled, Without complications - Most recent A1c of 8.9 %. Goal A1c < 7.0 %.   - A1c up from 8.4%  - She developed severe  nausea and vomiting to Trulicity.  - She is intolerant to pioglitazone - Unable to obtain DPP-4 inhibitors  - Will decrease Metformin due to diarrhea, she was advised to monitor and see if that helps , as I am not sure that's the cause - Will increase Jardiance and insulin as below    MEDICATIONS:  - Decrease Metfomrin 1 tablet in the morning and 1 tablets with Supper - Increase Lantus to 32  units daily  - Increase Jardiance 25 mg daily   - Start Olaglptin 25 mg daily     EDUCATION / INSTRUCTIONS:  BG monitoring instructions: Patient is instructed to check her blood sugars 1 times a day,  fasting  Call [place] [facility] if: BG persistently < 70  . I reviewed the Rule of 15 for the treatment of hypoglycemia in detail with the patient. Literature supplied.    2) Diabetic complications:   Eye: Does not have known diabetic retinopathy.   Neuro/ Feet: Does not have known diabetic peripheral neuropathy .   Renal: Patient does not have known baseline CKD. She   is on an ACEI/ARB at present.      3) Dyslipidemia :  - LDL trended up from 89 mg/dL to 111 mg/dL in 04/2020. She has Hx of elevated LFT's to Atorvastatin. She is intolerant to Simvastatin as well. We stopped  Pravastatin and started her on small dose of Crestor  By 06/2020.   - Lipid profile showed  LDL at goal and Tg is trending down   Medications  Continue Rosuvastatin 5 mg daily        F/U in 3 months    Signed electronically by: Mack Guise, MD  [facility]  [address] [place], [place] [address] Phone: [phone] FAX: [phone]   CC: Gaynelle Arabian, MD [address] [place] [address] Phone: [phone]  Fax: [phone]  Return to Endocrinology clinic as below: Future Appointments  Date Time Provider [place]  06/03/2021  7:30 AM Dominie Benedick, Melanie Crazier, MD [facility] None

## 2021-02-18 NOTE — Patient Instructions (Addendum)
-   Increase  Jardiance 25 mg daily with breakfast  - Decrease Metformin 500 mg, 1 tablet  Twice daily  - Increase  Lantus 32 units daily  - Start Alogliptin 25 mg daily        HOW TO TREAT LOW BLOOD SUGARS (Blood sugar LESS THAN 70 MG/DL)  Please follow the RULE OF 15 for the treatment of hypoglycemia treatment (when your (blood sugars are less than 70 mg/dL)    STEP 1: Take 15 grams of carbohydrates when your blood sugar is low, which includes:   3-4 GLUCOSE TABS  OR  3-4 OZ OF JUICE OR REGULAR SODA OR  ONE TUBE OF GLUCOSE GEL     STEP 2: RECHECK blood sugar in 15 MINUTES STEP 3: If your blood sugar is still low at the 15 minute recheck --> then, go back to STEP 1 and treat AGAIN with another 15 grams of carbohydrates.

## 2021-02-20 ENCOUNTER — Encounter: Payer: Self-pay | Admitting: Internal Medicine

## 2021-02-23 ENCOUNTER — Other Ambulatory Visit: Payer: Self-pay | Admitting: Internal Medicine

## 2021-02-23 MED ORDER — ALOGLIPTIN BENZOATE 25 MG PO TABS: 25.0000 mg | ORAL_TABLET | Freq: Every day | ORAL | 3 refills | Status: DC

## 2021-02-28 ENCOUNTER — Other Ambulatory Visit: Payer: Self-pay | Admitting: Internal Medicine

## 2021-02-28 DIAGNOSIS — E1165 Type 2 diabetes mellitus with hyperglycemia: Secondary | ICD-10-CM

## 2021-06-03 ENCOUNTER — Ambulatory Visit (INDEPENDENT_AMBULATORY_CARE_PROVIDER_SITE_OTHER): Payer: 59 | Admitting: Internal Medicine

## 2021-06-03 ENCOUNTER — Other Ambulatory Visit: Payer: Self-pay

## 2021-06-03 ENCOUNTER — Encounter: Payer: Self-pay | Admitting: Internal Medicine

## 2021-06-03 VITALS — BP 118/76 | HR 84 | Ht 63.0 in | Wt 223.4 lb

## 2021-06-03 DIAGNOSIS — E1165 Type 2 diabetes mellitus with hyperglycemia: Secondary | ICD-10-CM

## 2021-06-03 DIAGNOSIS — E782 Mixed hyperlipidemia: Secondary | ICD-10-CM | POA: Diagnosis not present

## 2021-06-03 LAB — POCT GLYCOSYLATED HEMOGLOBIN (HGB A1C): Hemoglobin A1C: 9.5 % — AB (ref 4.0–5.6)

## 2021-06-03 LAB — GLUCOSE, POCT (MANUAL RESULT ENTRY): POC Glucose: 184 mg/dl — AB (ref 70–99)

## 2021-06-03 MED ORDER — OZEMPIC (0.25 OR 0.5 MG/DOSE) 2 MG/1.5ML ~~LOC~~ SOPN: 0.5000 mg | PEN_INJECTOR | SUBCUTANEOUS | 6 refills | Status: DC

## 2021-06-03 MED ORDER — LANTUS SOLOSTAR 100 UNIT/ML ~~LOC~~ SOPN: 42.0000 [IU] | PEN_INJECTOR | Freq: Every day | SUBCUTANEOUS | 3 refills | Status: DC

## 2021-06-03 MED ORDER — ROSUVASTATIN CALCIUM 5 MG PO TABS: 5.0000 mg | ORAL_TABLET | Freq: Every day | ORAL | 3 refills | Status: DC

## 2021-06-03 NOTE — Progress Notes (Signed)
Name: Shelley Dixon  Age/ Sex: 60 y.o., female   MRN/ DOB: [id number], [birth year]     PCP: Gaynelle Arabian, MD   Reason for Endocrinology Evaluation: Type 2 Diabetes Mellitus  Initial Endocrine Consultative Visit: 11/08/2019      PATIENT IDENTIFIER: Shelley Dixon is a 60 y.o. female with a past medical history of T2Dm, HTN, CKD and Dyslipidemia . The patient has followed with Endocrinology clinic since 11/08/2019 for consultative assistance with management of her diabetes.  DIABETIC HISTORY:  [name] was diagnosed with DM many years ago. She reports intolerance to invokana - yeast infections. She recalls developing swelling  to Actos may have been swelling . Her hemoglobin A1c has ranged from 5.8% years ago , peaking at 10.9%  in 2021.  On her initial visit to our clinic she had an A1c of  10.9%  , she was on repaglinide, metformin, glimepiride and truclicity . We switched the repaglinide, and glimepiride to basal insulin, continued trulicity and metformin   Pt developed intolerance to Trulicity - vomiting and nausea. She already has reported intolerance of SGLT-2 inhibitors   Jardiance started 06/2020   Decreased Metformin due to diarrhea 02/2021  SUBJECTIVE:   During the last visit (02/18/2021): A1c 8.9 % . Continued lantus and Metformin and started Januvia     Today (06/03/2021): Ms. [name] is here for a follow up on diabetes management.  She checks her blood sugars 0 times lately. She went on vacation and fell off the wagon. The patient has not had hypoglycemic episodes since the last clinic visit.  Dexcom was not covered by insurance  Diarrhea has resolved with less metformin.  Denies nausea or vomiting   HOME DIABETES REGIMEN:  Lantus 38 units daily  Metformin 500 mg XR 1 tablet BID Jardiance 25 mg daily  Nesina 25 mg daily      Statin: yes ACE-I/ARB: yes   METER DOWNLOAD SUMMARY: 5/4-5/18/2022  Average Number Tests/Day = 0.2 Overall Mean  FS Glucose = 173 Standard Deviation = 24  BG Ranges: Low = 154 High = 200   Hypoglycemic Events/30 Days: BG < 50 = 0 Episodes of symptomatic severe hypoglycemia = 0      DIABETIC COMPLICATIONS: Microvascular complications:    Denies: retinopathy, neuropathy , CKD  Last eye exam: Completed 09/24/2020   Macrovascular complications:    Denies: CAD, PVD, CVA    HISTORY:  Past Medical History:  Past Medical History:  Diagnosis Date   Diabetes mellitus without complication ([place])    Past Surgical History: No past surgical history on file. Social History:  reports that she has quit smoking. She has never used smokeless tobacco. She reports current alcohol use of about 2.0 standard drinks per week. She reports that she does not use drugs. Family History: No family history on file.   HOME MEDICATIONS: Allergies as of 06/03/2021       Reactions   Atorvastatin    Elevated LFT   Bydureon [exenatide]    Injection site pain   Pioglitazone    swelling   Simvastatin    ineffective        Medication List        Accurate as of June 03, 2021  7:47 AM. If you have any questions, ask your nurse or doctor.          Accu-Chek Aviva Plus test strip Generic drug: glucose blood TEST 2 TIMES DAILY   Accu-Chek Aviva Plus w/Device Kit by Does  not apply route.   Alogliptin Benzoate 25 MG Tabs Commonly known as: Nesina Take 25 mg by mouth daily.   ALPRAZolam 0.5 MG tablet Commonly known as: XANAX Take 0.25-0.5 mg by mouth 2 (two) times daily as needed.   atenolol 50 MG tablet Commonly known as: TENORMIN Take 100 mg by mouth daily.   BD Pen Needle Nano 2nd Gen 32G X 4 MM Misc Generic drug: Insulin Pen Needle Use as direct to inject insulin daily   cholecalciferol 25 MCG (1000 UNIT) tablet Commonly known as: VITAMIN D3 Take 1,000 Units by mouth daily.   Dexcom G6 Sensor Misc 1 Device by Does not apply route as directed.   Dexcom G6 Transmitter Misc 1  Device by Does not apply route as directed.   empagliflozin 25 MG Tabs tablet Commonly known as: Jardiance Take 1 tablet (25 mg total) by mouth daily before breakfast.   esomeprazole 40 MG capsule Commonly known as: NEXIUM TAKE ONE CAPSULE BY MOUTH EVERY DAY FOR ACID REFLUX   Lantus SoloStar 100 UNIT/ML Solostar Pen Generic drug: insulin glargine Inject 32 Units into the skin daily.   lisinopril 10 MG tablet Commonly known as: ZESTRIL lisinopril  twice a day   metFORMIN 500 MG 24 hr tablet Commonly known as: GLUCOPHAGE-XR Take 1 tablet (500 mg total) by mouth in the morning and at bedtime.   rosuvastatin 5 MG tablet Commonly known as: Crestor Take 1 tablet (5 mg total) by mouth daily.   Vitamin B-12 1000 MCG/15ML Liqd Take by mouth.          OBJECTIVE:   Vital Signs: BP 118/76 (BP Location: Left Arm, Patient Position: Sitting, Cuff Size: Large)   Pulse 84   Ht 5' 3"  (1.6 m)   Wt 223 lb 6.4 oz (101.3 kg)   SpO2 99%   BMI 39.57 kg/m   Wt Readings from Last 3 Encounters:  06/03/21 223 lb 6.4 oz (101.3 kg)  02/18/21 225 lb 2 oz (102.1 kg)  10/16/20 220 lb (99.8 kg)     Exam: General: Pt appears well and is in NAD  Lungs: Clear with good BS bilat with no rales, rhonchi, or wheezes  Heart: RRR   Abdomen: Normoactive bowel sounds, soft, nontende  Extremities: Trace pretibial edema.  Neuro: MS is good with appropriate affect, pt is alert and Ox3    DM foot exam: 10/16/2020   The skin of the feet is intact without sores or ulcerations. The pedal pulses are 2+ on right and 2+ on left. The sensation is intact to a screening 5.07, 10 gram monofilament bilaterally    DATA REVIEWED:  04/21/2021 HDL 36 LDL 62 Tg 372 BUN/Cr 31/1.410  GFR 43   ASSESSMENT / PLAN / RECOMMENDATIONS:   1) Type 2 Diabetes Mellitus, Poorly  controlled, Without complications - Most recent A1c of 9.5 %. Goal A1c < 7.0 %.   - Poorly controlled diabetes due to dietary  indiscretions, she has been on vacation. She has been compliant with medication intake  - Insurance did not cover dexcom unless she is on MDI regimen - She developed severe nausea and vomiting to Trulicity, but she would like to try it again, I have recommended Ozempic instead  - She is intolerant to pioglitazone - I did explain to her that we are running out of options with glycemic classes and we are getting close to starting prandial insulin, I have encouraged her to follow a low carb diet and start checking glucose regularly  MEDICATIONS:  - Decrease Metfomrin 1 tablet in the morning and 1 tablets with Supper - Increase Lantus to 32  units daily  - Increase Jardiance 25 mg daily   - Start Olaglptin 25 mg daily     EDUCATION / INSTRUCTIONS: BG monitoring instructions: Patient is instructed to check her blood sugars 1 times a day,  fasting Call [place] [facility] if: BG persistently < 70  I reviewed the Rule of 15 for the treatment of hypoglycemia in detail with the patient. Literature supplied.    2) Diabetic complications:  Eye: Does not have known diabetic retinopathy.  Neuro/ Feet: Does not have known diabetic peripheral neuropathy .  Renal: Patient does not have known baseline CKD. She   is on an ACEI/ARB at present.      3) Dyslipidemia :  - LDL trended up from 89 mg/dL to 111 mg/dL in 04/2020. She has Hx of elevated LFT's to Atorvastatin. She is intolerant to Simvastatin as well. We stopped  Pravastatin and started her on small dose of Crestor  By 06/2020.   - She is compliant with it, Tg trending up , this is due to hyperglycemia . I have advised her to reduce CHO intake   Medications  Continue Rosuvastatin 5 mg daily       F/U in 3 months    Signed electronically by: Mack Guise, MD  [facility]  [facility] [address] [place], [place] [address] Phone: [phone] FAX:  [phone]   CC: Gaynelle Arabian, MD [address] [place] [address] Phone: [phone]  Fax: [phone]  Return to Endocrinology clinic as below: No future appointments.

## 2021-06-03 NOTE — Patient Instructions (Addendum)
-   Continue ardiance 25 mg daily with breakfast  - Continue Metformin 500 mg, 1 tablet  Twice daily  - Increase  Lantus 42 units daily  - STOP Nesina  - Start Ozempic 0.25 mg once weekly for 6 weeks, if no side effects, increase to 0.5 mg weekly       HOW TO TREAT LOW BLOOD SUGARS (Blood sugar LESS THAN 70 MG/DL) Please follow the RULE OF 15 for the treatment of hypoglycemia treatment (when your (blood sugars are less than 70 mg/dL)   STEP 1: Take 15 grams of carbohydrates when your blood sugar is low, which includes:  3-4 GLUCOSE TABS  OR 3-4 OZ OF JUICE OR REGULAR SODA OR ONE TUBE OF GLUCOSE GEL    STEP 2: RECHECK blood sugar in 15 MINUTES STEP 3: If your blood sugar is still low at the 15 minute recheck --> then, go back to STEP 1 and treat AGAIN with another 15 grams of carbohydrates.

## 2021-06-19 DIAGNOSIS — N390 Urinary tract infection, site not specified: Secondary | ICD-10-CM | POA: Diagnosis not present

## 2021-09-03 ENCOUNTER — Encounter: Payer: Self-pay | Admitting: Internal Medicine

## 2021-09-03 ENCOUNTER — Other Ambulatory Visit: Payer: Self-pay

## 2021-09-03 ENCOUNTER — Ambulatory Visit: Payer: 59 | Admitting: Internal Medicine

## 2021-09-03 VITALS — BP 110/68 | HR 79 | Ht 63.0 in | Wt 215.4 lb

## 2021-09-03 DIAGNOSIS — E782 Mixed hyperlipidemia: Secondary | ICD-10-CM

## 2021-09-03 DIAGNOSIS — E1165 Type 2 diabetes mellitus with hyperglycemia: Secondary | ICD-10-CM | POA: Diagnosis not present

## 2021-09-03 LAB — POCT GLYCOSYLATED HEMOGLOBIN (HGB A1C): Hemoglobin A1C: 9.3 % — AB (ref 4.0–5.6)

## 2021-09-03 MED ORDER — METFORMIN HCL ER 500 MG PO TB24: 500.0000 mg | ORAL_TABLET | Freq: Two times a day (BID) | ORAL | 3 refills | Status: DC

## 2021-09-03 MED ORDER — LANTUS SOLOSTAR 100 UNIT/ML ~~LOC~~ SOPN: 36.0000 [IU] | PEN_INJECTOR | Freq: Every day | SUBCUTANEOUS | 3 refills | Status: DC

## 2021-09-03 MED ORDER — OZEMPIC (1 MG/DOSE) 4 MG/3ML ~~LOC~~ SOPN: 1.0000 mg | PEN_INJECTOR | SUBCUTANEOUS | 2 refills | Status: DC

## 2021-09-03 MED ORDER — BD PEN NEEDLE NANO 2ND GEN 32G X 4 MM MISC
1.0000 | Freq: Every day | 3 refills | Status: DC
Start: 2021-09-03 — End: 2022-01-04

## 2021-09-03 NOTE — Patient Instructions (Addendum)
-   Continue Jardiance 25 mg daily with breakfast  - Continue Metformin 500 mg, 1 tablet  Twice daily  - Increase  Lantus 36 units daily  - Increase Ozempic 1  mg once weekly       HOW TO TREAT LOW BLOOD SUGARS (Blood sugar LESS THAN 70 MG/DL) Please follow the RULE OF 15 for the treatment of hypoglycemia treatment (when your (blood sugars are less than 70 mg/dL)   STEP 1: Take 15 grams of carbohydrates when your blood sugar is low, which includes:  3-4 GLUCOSE TABS  OR 3-4 OZ OF JUICE OR REGULAR SODA OR ONE TUBE OF GLUCOSE GEL    STEP 2: RECHECK blood sugar in 15 MINUTES STEP 3: If your blood sugar is still low at the 15 minute recheck --> then, go back to STEP 1 and treat AGAIN with another 15 grams of carbohydrates.

## 2021-09-03 NOTE — Progress Notes (Signed)
Name: Shelley Dixon  Age/ Sex: 60 y.o., female   MRN/ DOB: [id number], 1961-01-11     PCP: Gaynelle Arabian, MD   Reason for Endocrinology Evaluation: Type 2 Diabetes Mellitus  Initial Endocrine Consultative Visit: 11/08/2019      PATIENT IDENTIFIER: Shelley Dixon is a 60 y.o. female with a past medical history of T2Dm, HTN, CKD and Dyslipidemia . The patient has followed with Endocrinology clinic since 11/08/2019 for consultative assistance with management of her diabetes.  DIABETIC HISTORY:  Shelley Dixon was diagnosed with DM many years ago. She reports intolerance to invokana - yeast infections. She recalls developing swelling  to Actos may have been swelling . Her hemoglobin A1c has ranged from 5.8% years ago , peaking at 10.9%  in 2021.  On her initial visit to our clinic she had an A1c of  10.9%  , she was on repaglinide, metformin, glimepiride and truclicity . We switched the repaglinide, and glimepiride to basal insulin, continued trulicity and metformin   Pt developed intolerance to Trulicity - vomiting and nausea. She already has reported intolerance of SGLT-2 inhibitors   Jardiance started 06/2020   Decreased Metformin due to diarrhea 02/2021  Start Ozempic 05/2021  SUBJECTIVE:   During the last visit (06/03/2021): A1c 9.5 % . Continued lantus and Metformin and Jardiance and started DPP-4 inhibitors      Today (09/03/2021): Shelley Dixon is here for a follow up on diabetes management.  She checks her blood sugars 1-2 times lately. She went on vacation and fell off the wagon. The patient has not had hypoglycemic episodes since the last clinic visit.  Dexcom was not covered by insurance  Diarrhea has resolved with less metformin.  Denies nausea or vomiting except with drainage    She does admit  drinking regular soda last night   HOME DIABETES REGIMEN:  Lantus 32 units daily  Metformin 500 mg XR 1 tablet BID Jardiance 25 mg daily  Ozempic 0.5 mg  weekly      Statin: yes ACE-I/ARB: yes   METER DOWNLOAD SUMMARY: unable to download 149-330 mg/dL       DIABETIC COMPLICATIONS: Microvascular complications:    Denies: retinopathy, neuropathy , CKD  Last eye exam: Completed 09/24/2020   Macrovascular complications:    Denies: CAD, PVD, CVA    HISTORY:  Past Medical History:  Past Medical History:  Diagnosis Date   Diabetes mellitus without complication ([place])    Past Surgical History: No past surgical history on file. Social History:  reports that she has quit smoking. She has never used smokeless tobacco. She reports current alcohol use of about 2.0 standard drinks per week. She reports that she does not use drugs. Family History: No family history on file.   HOME MEDICATIONS: Allergies as of 09/03/2021       Reactions   Atorvastatin    Elevated LFT   Bydureon [exenatide]    Injection site pain   Canagliflozin Other (See Comments)   Pioglitazone    swelling   Saxagliptin Other (See Comments)   Simvastatin    ineffective        Medication List        Accurate as of September 03, 2021  8:12 AM. If you have any questions, ask your nurse or doctor.          STOP taking these medications    Dexcom G6 Sensor Misc Stopped by: Dorita Sciara, MD   Dexcom G6 Transmitter Misc Stopped  by: Dorita Sciara, MD       TAKE these medications    Accu-Chek Aviva Plus test strip Generic drug: glucose blood TEST 2 TIMES DAILY   Accu-Chek Aviva Plus w/Device Kit by Does not apply route.   ALPRAZolam 0.5 MG tablet Commonly known as: XANAX Take 0.25-0.5 mg by mouth 2 (two) times daily as needed.   atenolol 50 MG tablet Commonly known as: TENORMIN Take 100 mg by mouth daily.   BD Pen Needle Nano 2nd Gen 32G X 4 MM Misc Generic drug: Insulin Pen Needle Use as direct to inject insulin daily   cholecalciferol 25 MCG (1000 UNIT) tablet Commonly known as: VITAMIN D3 Take 1,000 Units by  mouth daily.   empagliflozin 25 MG Tabs tablet Commonly known as: Jardiance Take 1 tablet (25 mg total) by mouth daily before breakfast.   esomeprazole 40 MG capsule Commonly known as: NEXIUM TAKE ONE CAPSULE BY MOUTH EVERY DAY FOR ACID REFLUX   Lantus SoloStar 100 UNIT/ML Solostar Pen Generic drug: insulin glargine Inject 42 Units into the skin daily.   lisinopril 10 MG tablet Commonly known as: ZESTRIL lisinopril  twice a day   metFORMIN 500 MG 24 hr tablet Commonly known as: GLUCOPHAGE-XR Take 1 tablet (500 mg total) by mouth in the morning and at bedtime.   Ozempic (0.25 or 0.5 MG/DOSE) 2 MG/1.5ML Sopn Generic drug: Semaglutide(0.25 or 0.5MG/DOS) Inject 0.5 mg into the skin once a week.   rosuvastatin 5 MG tablet Commonly known as: Crestor Take 1 tablet (5 mg total) by mouth daily.   Vitamin B-12 1000 MCG/15ML Liqd Take by mouth.          OBJECTIVE:   Vital Signs: BP 110/68 (BP Location: Left Arm, Patient Position: Sitting, Cuff Size: Small)   Pulse 79   Ht 5' 3"  (1.6 m)   Wt 215 lb 6.4 oz (97.7 kg)   SpO2 99%   BMI 38.16 kg/m   Wt Readings from Last 3 Encounters:  09/03/21 215 lb 6.4 oz (97.7 kg)  06/03/21 223 lb 6.4 oz (101.3 kg)  02/18/21 225 lb 2 oz (102.1 kg)     Exam: General: Pt appears well and is in NAD  Lungs: Clear with good BS bilat with no rales, rhonchi, or wheezes  Heart: RRR   Abdomen: Normoactive bowel sounds, soft, nontende  Extremities: Trace pretibial edema.  Neuro: MS is good with appropriate affect, pt is alert and Ox3    DM foot exam: 09/03/2021   The skin of the feet is intact without sores or ulcerations. The pedal pulses are 2+ on right and 2+ on left. The sensation is intact to a screening 5.07, 10 gram monofilament bilaterally    DATA REVIEWED:  04/21/2021 HDL 36 LDL 62 Tg 372 BUN/Cr 31/1.410  GFR 43   ASSESSMENT / PLAN / RECOMMENDATIONS:   1) Type 2 Diabetes Mellitus, Poorly  controlled, Without  complications - Most recent A1c of 9.3 %. Goal A1c < 7.0 %.   - A1c went from 9.5% to 9.3%  [employer] did not cover dexcom unless she is on MDI regimen - She developed severe nausea and vomiting to Trulicity, tolerating Ozempic, will increase the dose  - She is intolerant to pioglitazone   MEDICATIONS:  - Continue  Metfomrin 1 tablet in the morning and 1 tablets with Supper - Increase Lantus to 36  units daily  - Continue  Jardiance 25 mg daily   - Increase Ozempic to 1 mg  weekly     EDUCATION / INSTRUCTIONS: BG monitoring instructions: Patient is instructed to check her blood sugars 1 times a day,  fasting Call [place] [facility] if: BG persistently < 70  I reviewed the Rule of 15 for the treatment of hypoglycemia in detail with the patient. Literature supplied.    2) Diabetic complications:  Eye: Does not have known diabetic retinopathy.  Neuro/ Feet: Does not have known diabetic peripheral neuropathy .  Renal: Patient does not have known baseline CKD. She   is on an ACEI/ARB at present.      3) Dyslipidemia :  - LDL trended up from 89 mg/dL to 111 mg/dL in 04/2020. She has Hx of elevated LFT's to Atorvastatin. She is intolerant to Simvastatin as well. We stopped  Pravastatin and started her on small dose of Crestor  By 06/2020.  - Will check lipids on next visit   Medications  Continue Rosuvastatin 5 mg daily       F/U in 3 months    Signed electronically by: Mack Guise, MD  [facility] End[facility]  [facility] [address] [place], [place] [address] Phone: [phone] FAX: [phone]   CC: Gaynelle Arabian, MD [address] [place] [address] Phone: [phone]  Fax: [phone]  Return to Endocrinology clinic as below: No future appointments.

## 2022-01-04 ENCOUNTER — Ambulatory Visit (INDEPENDENT_AMBULATORY_CARE_PROVIDER_SITE_OTHER): Payer: 59 | Admitting: Internal Medicine

## 2022-01-04 ENCOUNTER — Other Ambulatory Visit: Payer: Self-pay | Admitting: Internal Medicine

## 2022-01-04 ENCOUNTER — Encounter: Payer: Self-pay | Admitting: Internal Medicine

## 2022-01-04 VITALS — BP 118/72 | HR 88 | Ht 63.0 in | Wt 204.8 lb

## 2022-01-04 DIAGNOSIS — E119 Type 2 diabetes mellitus without complications: Secondary | ICD-10-CM | POA: Diagnosis not present

## 2022-01-04 DIAGNOSIS — Z794 Long term (current) use of insulin: Secondary | ICD-10-CM | POA: Diagnosis not present

## 2022-01-04 DIAGNOSIS — E782 Mixed hyperlipidemia: Secondary | ICD-10-CM

## 2022-01-04 LAB — POCT GLYCOSYLATED HEMOGLOBIN (HGB A1C): Hemoglobin A1C: 6.9 % — AB (ref 4.0–5.6)

## 2022-01-04 MED ORDER — METFORMIN HCL ER 500 MG PO TB24: 1500.0000 mg | ORAL_TABLET | Freq: Every day | ORAL | 3 refills | Status: DC

## 2022-01-04 MED ORDER — LANTUS SOLOSTAR 100 UNIT/ML ~~LOC~~ SOPN: 36.0000 [IU] | PEN_INJECTOR | Freq: Every day | SUBCUTANEOUS | 3 refills | Status: DC

## 2022-01-04 MED ORDER — ROSUVASTATIN CALCIUM 5 MG PO TABS: 5.0000 mg | ORAL_TABLET | Freq: Every day | ORAL | 3 refills | Status: DC

## 2022-01-04 MED ORDER — OZEMPIC (1 MG/DOSE) 4 MG/3ML ~~LOC~~ SOPN: 1.0000 mg | PEN_INJECTOR | SUBCUTANEOUS | 3 refills | Status: DC

## 2022-01-04 MED ORDER — BD PEN NEEDLE NANO 2ND GEN 32G X 4 MM MISC: 1.0000 | Freq: Every day | 3 refills | Status: DC

## 2022-01-04 NOTE — Patient Instructions (Addendum)
-   Keep Up the Good Work ! ?- Continue Jardiance 25 mg daily with breakfast  ?- Continue Metformin 500 mg, 1 tablet  Twice daily  ?- Continue  Lantus 36 units daily  ?- Continue Ozempic 1  mg once weekly  ? ? ? ? ? ?HOW TO TREAT LOW BLOOD SUGARS (Blood sugar LESS THAN 70 MG/DL) ?Please follow the RULE OF 15 for the treatment of hypoglycemia treatment (when your (blood sugars are less than 70 mg/dL)  ? ?STEP 1: Take 15 grams of carbohydrates when your blood sugar is low, which includes:  ?3-4 GLUCOSE TABS  OR ?3-4 OZ OF JUICE OR REGULAR SODA OR ?ONE TUBE OF GLUCOSE GEL   ? ?STEP 2: RECHECK blood sugar in 15 MINUTES ?STEP 3: If your blood sugar is still low at the 15 minute recheck --> then, go back to STEP 1 and treat AGAIN with another 15 grams of carbohydrates. ? ?

## 2022-01-04 NOTE — Progress Notes (Signed)
? ?Name: Shelley Dixon  ?Age/ Sex: 61 y.o., female   ?MRN/ DOB: [id number], 01/31/1961    ? ?PCP: Gaynelle Arabian, MD   ?Reason for Endocrinology Evaluation: Type 2 Diabetes Mellitus  ?Initial Endocrine Consultative Visit: 11/08/2019  ? ? ? ? ?PATIENT IDENTIFIER: Shelley Dixon is a 61 y.o. female with a past medical history of T2Dm, HTN, CKD and Dyslipidemia . The patient has followed with Endocrinology clinic since 11/08/2019 for consultative assistance with management of her diabetes. ? ?DIABETIC HISTORY:  ?Shelley Dixon was diagnosed with DM many years ago. She reports intolerance to invokana - yeast infections. She recalls developing swelling  to Actos may have been swelling . Her hemoglobin A1c has ranged from 5.8% years ago , peaking at 10.9%  in 2021. ? ?On her initial visit to our clinic she had an A1c of  10.9%  , she was on repaglinide, metformin, glimepiride and truclicity . We switched the repaglinide, and glimepiride to basal insulin, continued trulicity and metformin ? ? ?Pt developed intolerance to Trulicity - vomiting and nausea. She already has reported intolerance of SGLT-2 inhibitors  ? ?Jardiance started 06/2020  ? ?Decreased Metformin due to diarrhea 02/2021 ? ?Start Ozempic 05/2021 ? ?SUBJECTIVE:  ? ?During the last visit (09/03/2021): A1c 9.3 % . Continued lantus and Metformin and Jardiance and started DPP-4 inhibitors  ? ? ? ? ?Today (01/04/2022): Shelley Dixon is here for a follow up on diabetes management.  She checks her blood sugars 1-2 times daily .  The patient has not had hypoglycemic episodes since the last clinic visit. ? ?Dexcom was not covered by insurance  ?Diarrhea has resolved with less metformin.  ?Denies nausea or vomiting or diarrhea  ?She is pending knee sx  ? ? ? ?HOME ENDOCRINE  REGIMEN:  ?Lantus 36 units daily  ?Metformin 500 mg XR 1 tablet QAM and 2 tabs QPM  ?Jardiance 25 mg daily  ?Ozempic 1 mg weekly  ?Rosuvastatin 5 mg daily  ? ? ? ?Statin: yes ?ACE-I/ARB:  yes ? ? ?METER DOWNLOAD SUMMARY: unable to download ?116- 140 mg/dL  ? ? ? ? ? ?DIABETIC COMPLICATIONS: ?Microvascular complications:  ?  ?Denies: retinopathy, neuropathy , CKD  ?Last eye exam: Completed 2022, scheduled next week  ?  ?Macrovascular complications:  ?  ?Denies: CAD, PVD, CVA ? ? ? ?HISTORY:  ?Past Medical History:  ?Past Medical History:  ?Diagnosis Date  ? Diabetes mellitus without complication ([place])   ? ?Past Surgical History: No past surgical history on file. ?Social History:  reports that she has quit smoking. She has never used smokeless tobacco. She reports current alcohol use of about 2.0 standard drinks per week. She reports that she does not use drugs. ?Family History: No family history on file. ? ? ?HOME MEDICATIONS: ?Allergies as of 01/04/2022   ? ?   Reactions  ? Atorvastatin   ? Elevated LFT  ? Bydureon [exenatide]   ? Injection site pain  ? Canagliflozin Other (See Comments)  ? Pioglitazone   ? swelling  ? Saxagliptin Other (See Comments)  ? Simvastatin   ? ineffective  ? ?  ? ?  ?Medication List  ?  ? ?  ? Accurate as of January 04, 2022  7:49 AM. If you have any questions, ask your nurse or doctor.  ?  ?  ? ?  ? ?Accu-Chek Aviva Plus test strip ?Generic drug: glucose blood ?TEST 2 TIMES DAILY ?  ?Accu-Chek Aviva Plus w/Device Kit ?by  Does not apply route. ?  ?ALPRAZolam 0.5 MG tablet ?Commonly known as: Duanne Moron ?Take 0.25-0.5 mg by mouth 2 (two) times daily as needed. ?  ?atenolol 50 MG tablet ?Commonly known as: TENORMIN ?Take 100 mg by mouth daily. ?  ?BD Pen Needle Nano 2nd Gen 32G X 4 MM Misc ?Generic drug: Insulin Pen Needle ?Inject 1 Device into the skin daily in the afternoon. Use as direct to inject insulin daily ?  ?cholecalciferol 25 MCG (1000 UNIT) tablet ?Commonly known as: VITAMIN D3 ?Take 1,000 Units by mouth daily. ?  ?empagliflozin 25 MG Tabs tablet ?Commonly known as: Jardiance ?Take 1 tablet (25 mg total) by mouth daily before breakfast. ?  ?esomeprazole 40 MG  capsule ?Commonly known as: [place] ?TAKE ONE CAPSULE BY MOUTH EVERY DAY FOR ACID REFLUX ?  ?Lantus SoloStar 100 UNIT/ML Solostar Pen ?Generic drug: insulin glargine ?Inject 36 Units into the skin daily. ?  ?lisinopril 10 MG tablet ?Commonly known as: ZESTRIL ?lisinopril ? twice a day ?  ?metFORMIN 500 MG 24 hr tablet ?Commonly known as: GLUCOPHAGE-XR ?Take 1 tablet (500 mg total) by mouth in the morning and at bedtime. ?  ?Ozempic (1 MG/DOSE) 4 MG/3ML Sopn ?Generic drug: Semaglutide (1 MG/DOSE) ?Inject 1 mg into the skin once a week. ?  ?rosuvastatin 5 MG tablet ?Commonly known as: Crestor ?Take 1 tablet (5 mg total) by mouth daily. ?  ?scopolamine 1 MG/3DAYS ?Commonly known as: TRANSDERM-SCOP ?SMARTSIG:1 Patch(s) Topical Every 3 Days PRN ?  ?Vitamin B-12 1000 MCG/15ML Liqd ?Take by mouth. ?  ? ?  ? ? ?  ?OBJECTIVE:  ? ?Vital Signs: BP 118/72 (BP Location: Left Arm, Patient Position: Sitting, Cuff Size: Large)   Pulse 88   Ht 5' 3"  (1.6 m)   Wt 204 lb 12.8 oz (92.9 kg)   SpO2 96%   BMI 36.28 kg/m?   ?Wt Readings from Last 3 Encounters:  ?01/04/22 204 lb 12.8 oz (92.9 kg)  ?09/03/21 215 lb 6.4 oz (97.7 kg)  ?06/03/21 223 lb 6.4 oz (101.3 kg)  ? ? ? ?Exam: ?General: Pt appears well and is in NAD  ?Lungs: Clear with good BS bilat with no rales, rhonchi, or wheezes  ?Heart: RRR   ?Abdomen: Normoactive bowel sounds, soft, nontende  ?Extremities: Trace pretibial edema.  ?Neuro: MS is good with appropriate affect, pt is alert and Ox3  ? ? ?DM foot exam: 09/03/2021 ?  ?The skin of the feet is intact without sores or ulcerations. ?The pedal pulses are 2+ on right and 2+ on left. ?The sensation is intact to a screening 5.07, 10 gram monofilament bilaterally ? ? ? ?DATA REVIEWED: ? ?04/21/2021 ?HDL 36 ?LDL 62 ?Tg 372 ?BUN/Cr 31/1.410  ?GFR 43  ? ?ASSESSMENT / PLAN / RECOMMENDATIONS:  ? ?1) Type 2 Diabetes Mellitus, Optimally  controlled, Without complications - Most recent A1c of 6.9 %. Goal A1c < 7.0 %. ? ? ?- A1c trended  down from 9.2% to 6.9%  ?- praised the pt on weight loss and optimizing glucose control  ?- Insurance did not cover dexcom  ?- She developed severe nausea and vomiting to Trulicity, tolerating Ozempic ?- She is intolerant to pioglitazone ? ? ?MEDICATIONS: ? ?-Continue  Metfomrin 1 tablet in the morning and 2 tablets with Supper ?-Continue Lantus 36  units daily  ?-Continue  Jardiance 25 mg daily   ?-Continue Ozempic 1 mg weekly  ? ? ? ?EDUCATION / INSTRUCTIONS: ?BG monitoring instructions: Patient is instructed to check her  blood sugars 1 times a day,  fasting ?Call [facility] if: BG persistently < 70  ?I reviewed the Rule of 15 for the treatment of hypoglycemia in detail with the patient. Literature supplied. ? ? ? ?2) Diabetic complications:  ?Eye: Does not have known diabetic retinopathy.  ?Neuro/ Feet: Does not have known diabetic peripheral neuropathy .  ?Renal: Patient does not have known baseline CKD. She   is on an ACEI/ARB at present.  ? ? ? ? ?3) Dyslipidemia : ? ?- LDL trended up from 89 mg/dL to 111 mg/dL in 04/2020. She has Hx of elevated LFT's to Atorvastatin. She is intolerant to Simvastatin as well. We stopped  Pravastatin and started her on small dose of Crestor  By 06/2020, with optimization of LDL ? ? ?Medications ? ?Continue Rosuvastatin 5 mg daily  ? ? ? ? ? ?F/U in 6 months  ? ? ?Signed electronically by: ?Abby Nena Jordan, MD ? ?[facility]  ?[facility] ?[address] ?[place], [place] [address] ?Phone: [phone] ?FAX: [phone] ? ? ?CC: ?Gaynelle Arabian, MD ?[address] ?[place] [address] ?Phone: [phone]  ?Fax: [phone] ? ?Return to Endocrinology clinic as below: ?Future Appointments  ?Date Time Provider [place]  ?01/04/2022  7:50 AM Charlsie Fleeger, Melanie Crazier, MD [facility] None  ? ? ?  ? ? ?

## 2022-01-14 DIAGNOSIS — M79641 Pain in right hand: Secondary | ICD-10-CM | POA: Diagnosis not present

## 2022-01-18 DIAGNOSIS — M25531 Pain in right wrist: Secondary | ICD-10-CM | POA: Diagnosis not present

## 2022-01-22 DIAGNOSIS — M170 Bilateral primary osteoarthritis of knee: Secondary | ICD-10-CM | POA: Diagnosis not present

## 2022-01-25 DIAGNOSIS — M25531 Pain in right wrist: Secondary | ICD-10-CM | POA: Diagnosis not present

## 2022-01-27 DIAGNOSIS — M25631 Stiffness of right wrist, not elsewhere classified: Secondary | ICD-10-CM | POA: Diagnosis not present

## 2022-02-08 DIAGNOSIS — M25631 Stiffness of right wrist, not elsewhere classified: Secondary | ICD-10-CM | POA: Diagnosis not present

## 2022-02-15 DIAGNOSIS — M25531 Pain in right wrist: Secondary | ICD-10-CM | POA: Diagnosis not present

## 2022-02-22 DIAGNOSIS — H5203 Hypermetropia, bilateral: Secondary | ICD-10-CM | POA: Diagnosis not present

## 2022-02-22 DIAGNOSIS — E119 Type 2 diabetes mellitus without complications: Secondary | ICD-10-CM | POA: Diagnosis not present

## 2022-03-02 ENCOUNTER — Encounter: Payer: Self-pay | Admitting: Internal Medicine

## 2022-05-20 ENCOUNTER — Other Ambulatory Visit: Payer: Self-pay | Admitting: Obstetrics and Gynecology

## 2022-05-20 DIAGNOSIS — N644 Mastodynia: Secondary | ICD-10-CM

## 2022-06-02 ENCOUNTER — Ambulatory Visit
Admission: RE | Admit: 2022-06-02 | Discharge: 2022-06-02 | Disposition: A | Discharge status: Home / Self Care | Payer: 59 | Source: Ambulatory Visit | Attending: Obstetrics and Gynecology | Admitting: Obstetrics and Gynecology

## 2022-06-02 DIAGNOSIS — N644 Mastodynia: Secondary | ICD-10-CM

## 2022-06-15 NOTE — H&P (Cosign Needed Addendum)
TOTAL KNEE ADMISSION H&P  Patient is being admitted for left total knee arthroplasty.  Subjective:  Chief Complaint: Left knee pain.  HPI: Shelley Dixon, 61 y.o. female has a history of pain and functional disability in the left knee due to arthritis and has failed non-surgical conservative treatments for greater than 12 weeks to include NSAID's and/or analgesics, corticosteriod injections, viscosupplementation injections, weight reduction as appropriate, and activity modification. Onset of symptoms was gradual, starting several years ago with gradually worsening course since that time. The patient noted prior procedures on the knee to include  arthroscopy and menisectomy on the left knee.  Patient currently rates pain in the left knee at 7 out of 10 with activity. Patient has night pain, worsening of pain with activity and weight bearing, and pain that interferes with activities of daily living. Patient has evidence of  severe bone-on-bone arthritis in the lateral and patellofemoral compartments of the left knee with significant valgus deformity  by imaging studies. There is no active infection.  Patient Active Problem List   Diagnosis Date Noted   Type 2 diabetes mellitus without complication, with long-term current use of insulin ([place]) 01/04/2022   Mixed hyperlipidemia 06/03/2021   Dyslipidemia 11/08/2019   Type 2 diabetes mellitus with hyperglycemia, without long-term current use of insulin ([place]) 11/08/2019    Past Medical History:  Diagnosis Date   Diabetes mellitus without complication ([place])     No past surgical history on file.  Prior to Admission medications   Medication Sig Start Date End Date Taking? Authorizing Provider  ALPRAZolam Duanne Moron) 0.5 MG tablet Take 0.25-0.5 mg by mouth 2 (two) times daily as needed. 07/10/19   [provider]  atenolol (TENORMIN) 50 MG tablet Take 100 mg by mouth daily. 09/22/19   [provider]  Blood Glucose Monitoring Suppl  (ACCU-CHEK AVIVA PLUS) w/Device KIT by Does not apply route.    [provider]  cholecalciferol (VITAMIN D3) 25 MCG (1000 UNIT) tablet Take 1,000 Units by mouth daily.    [provider]  Cyanocobalamin (VITAMIN B-12) 1000 MCG/15ML LIQD Take by mouth.    [provider]  empagliflozin (JARDIANCE) 25 MG TABS tablet Take 1 tablet (25 mg total) by mouth daily before breakfast. 02/18/21   Shamleffer, Melanie Crazier, MD  esomeprazole (NEXIUM) 40 MG capsule TAKE ONE CAPSULE BY MOUTH EVERY DAY FOR ACID REFLUX 10/14/15   [provider]  glucose blood (ACCU-CHEK AVIVA PLUS) test strip TEST 2 TIMES DAILY 02/28/21   Shamleffer, Melanie Crazier, MD  insulin glargine, 1 Unit Dial, (TOUJEO SOLOSTAR) 300 UNIT/ML Solostar Pen Inject 36 Units into the skin daily in the afternoon. 01/04/22   Shamleffer, Melanie Crazier, MD  Insulin Pen Needle (BD PEN NEEDLE NANO 2ND GEN) 32G X 4 MM MISC Inject 1 Device into the skin daily in the afternoon. Use as direct to inject insulin daily 01/04/22   Shamleffer, Melanie Crazier, MD  lisinopril (ZESTRIL) 10 MG tablet lisinopril  twice a day    [provider]  metFORMIN (GLUCOPHAGE-XR) 500 MG 24 hr tablet Take 3 tablets (1,500 mg total) by mouth daily. 01/04/22   Shamleffer, Melanie Crazier, MD  rosuvastatin (CRESTOR) 5 MG tablet Take 1 tablet (5 mg total) by mouth daily. 01/04/22   Shamleffer, Melanie Crazier, MD  scopolamine (TRANSDERM-SCOP) 1 MG/3DAYS SMARTSIG:1 Patch(s) Topical Every 3 Days PRN 12/23/21   [provider]  Semaglutide, 1 MG/DOSE, (OZEMPIC, 1 MG/DOSE,) 4 MG/3ML SOPN Inject 1 mg into the skin once a  week. 01/04/22   Shamleffer, Melanie Crazier, MD    Allergies  Allergen Reactions   Atorvastatin     Elevated LFT   Bydureon [Exenatide]     Injection site pain   Canagliflozin Other (See Comments)   Pioglitazone     swelling   Saxagliptin Other (See Comments)   Simvastatin     ineffective    Social History    Socioeconomic History   Marital status: Single    Spouse name: Not on file   Number of children: Not on file   Years of education: Not on file   Highest education level: Not on file  Occupational History   Not on file  Tobacco Use   Smoking status: Former   Smokeless tobacco: Never  Substance and Sexual Activity   Alcohol use: Yes    Alcohol/week: 2.0 standard drinks of alcohol    Types: 2 Glasses of wine per week   Drug use: Never   Sexual activity: Not on file  Other Topics Concern   Not on file  Social History Narrative   Not on file   Social Determinants of Health   Financial Resource Strain: Not on file  Food Insecurity: Not on file  Transportation Needs: Not on file  Physical Activity: Not on file  Stress: Not on file  Social Connections: Not on file  Intimate Partner Violence: Not on file    Tobacco Use: Medium Risk (06/02/2022)   Patient History    Smoking Tobacco Use: Former    Smokeless Tobacco Use: Never    Passive Exposure: Not on file   Social History   Substance and Sexual Activity  Alcohol Use Yes   Alcohol/week: 2.0 standard drinks of alcohol   Types: 2 Glasses of wine per week    No family history on file.  Review of Systems  Constitutional:  Negative for chills and fever.  HENT: Negative.    Eyes: Negative.   Respiratory:  Negative for cough and shortness of breath.   Cardiovascular:  Negative for chest pain and palpitations.  Gastrointestinal:  Negative for abdominal pain, constipation, diarrhea, nausea and vomiting.  Genitourinary:  Negative for dysuria, frequency and urgency.  Musculoskeletal:  Positive for joint pain.  Skin:  Negative for rash.    Objective:  Physical Exam: Well nourished and well developed.  General: Alert and oriented x3, cooperative and pleasant, no acute distress.  Head: normocephalic, atraumatic, neck supple.  Eyes: EOMI.  Abdomen: non-tender to palpation and soft, normoactive bowel  sounds. Musculoskeletal: The patient has an antalgic gait pattern favoring the left side without the use of assistive devices.  Bilateral Hip Exam: The range of motion: normal without discomfort.  Right Knee Exam: No effusion present. No swelling present. The range of motion is: 0 to 125 degrees. Mild crepitus on range of motion of the knee. Positive medial joint line tenderness. No lateral joint line tenderness. The knee is stable.  Left Knee Exam: Valgus deformity. No effusion present. No swelling present. The Range of motion is: 5 to 125 degrees. Marked crepitus on range of motion of the knee. Positive lateral greater than medial joint line tenderness. The knee is stable. She has an audible pop when I extend the knee that is referable to the lateral compartment.  Calves soft and nontender. Motor function intact in LE. Strength 5/5 LE bilaterally. Neuro: Distal pulses 2+. Sensation to light touch intact in LE.  Vital signs in last 24 hours: BP: ()/()  Arterial Line BP: ()/()  Imaging Review Plain radiographs demonstrate severe degenerative joint disease of the left knee. The overall alignment is significant valgus. The bone quality appears to be adequate for age and reported activity level.  Assessment/Plan:  End stage arthritis, left knee   The patient history, physical examination, clinical judgment of the provider and imaging studies are consistent with end stage degenerative joint disease of the left knee and total knee arthroplasty is deemed medically necessary. The treatment options including medical management, injection therapy arthroscopy and arthroplasty were discussed at length. The risks and benefits of total knee arthroplasty were presented and reviewed. The risks due to aseptic loosening, infection, stiffness, patella tracking problems, thromboembolic complications and other imponderables were discussed. The patient acknowledged the explanation, agreed to  proceed with the plan and consent was signed. Patient is being admitted for inpatient treatment for surgery, pain control, PT, OT, prophylactic antibiotics, VTE prophylaxis, progressive ambulation and ADLs and discharge planning. The patient is planning to be discharged  home .  Patient's anticipated LOS is less than 2 midnights, meeting these requirements: - Younger than 24 - Lives within 1 hour of care - Has a competent adult at home to recover with post-op - NO history of  - Chronic pain requiring opiods  - Coronary Artery Disease  - Heart failure  - Heart attack  - Stroke  - DVT/VTE  - Cardiac arrhythmia  - Respiratory Failure/COPD  - Renal failure  - Anemia  - Advanced Liver disease  Therapy Plans: EO [place] Disposition: Home with Husband Planned DVT Prophylaxis: Aspirin 325 mg BID DME Needed: None PCP: Mickel Crow, MD (clearance received) TXA: IV Allergies: atorvastatin, simvastatin, bydureon, invokana, onglyza, trulicity Anesthesia Concerns: None BMI: 34.9 Last HgbA1c: 6.9 in March 2023 - will recheck with pre-op labs  Pharmacy: [facility])  - Patient was instructed on what medications to stop prior to surgery. - Follow-up visit in 2 weeks with Dr. Wynelle Link - Begin physical therapy following surgery - Pre-operative lab work as pre-surgical testing - Prescriptions will be provided in hospital at time of discharge  R. Jaynie Bream, PA-C Orthopedic Surgery [facility] [place]

## 2022-06-24 NOTE — Patient Instructions (Addendum)
SURGICAL WAITING ROOM VISITATION Patients having surgery or a procedure may have no more than 2 support people in the waiting area - these visitors may rotate.   Children under the age of 48 must have an adult with them who is not the patient. If the patient needs to stay at the hospital during part of their recovery, the visitor guidelines for inpatient rooms apply. Pre-op nurse will coordinate an appropriate time for 1 support person to accompany patient in pre-op.  This support person may not rotate.    Please refer to the [facility] website for the visitor guidelines for Inpatients (after your surgery is over and you are in a regular room).      Your procedure is scheduled on: 07-05-22   Report to [facility] Main Entrance    Report to admitting at 5:45 AM   Call this number if you have problems the morning of surgery [phone]   Do not eat food :After Midnight.   After Midnight you may have the following liquids until 5:15 AM DAY OF SURGERY  Water Non-Citrus Juices (without pulp, NO RED) Carbonated Beverages Black Coffee (NO MILK/CREAM OR CREAMERS, sugar ok)  Clear Tea (NO MILK/CREAM OR CREAMERS, sugar ok) regular and decaf                             Plain Jell-O (NO RED)                                           Fruit ices (not with fruit pulp, NO RED)                                     Popsicles (NO RED)                                                               Sports drinks like Gatorade (NO RED)                   The day of surgery:  Drink ONE (1) Pre-Surgery G2 at 5:15 AM the morning of surgery. Drink in one sitting. Do not sip.  This drink was given to you during your hospital  pre-op appointment visit. Nothing else to drink after completing the Pre-Surgery G2.          If you have questions, please contact your surgeon's office.   FOLLOW ANY ADDITIONAL PRE OP INSTRUCTIONS YOU RECEIVED FROM YOUR SURGEON'S OFFICE!!!     Oral Hygiene is also  important to reduce your risk of infection.                                    Remember - BRUSH YOUR TEETH THE MORNING OF SURGERY WITH YOUR REGULAR TOOTHPASTE   Take these medicines the morning of surgery with A SIP OF WATER: None   How to Manage Your Diabetes Before and After Surgery  Why is it important to control my blood sugar  before and after surgery? Improving blood sugar levels before and after surgery helps healing and can limit problems. A way of improving blood sugar control is eating a healthy diet by:  Eating less sugar and carbohydrates  Increasing activity/exercise  Talking with your doctor about reaching your blood sugar goals High blood sugars (greater than 180 mg/dL) can raise your risk of infections and slow your recovery, so you will need to focus on controlling your diabetes during the weeks before surgery. Make sure that the doctor who takes care of your diabetes knows about your planned surgery including the date and location.  How do I manage my blood sugar before surgery? Check your blood sugar at least 4 times a day, starting 2 days before surgery, to make sure that the level is not too high or low. Check your blood sugar the morning of your surgery when you wake up and every 2 hours until you get to the Short Stay unit. If your blood sugar is less than 70 mg/dL, you will need to treat for low blood sugar: Do not take insulin. Treat a low blood sugar (less than 70 mg/dL) with  cup of clear juice (cranberry or apple), 4 glucose tablets, OR glucose gel. Recheck blood sugar in 15 minutes after treatment (to make sure it is greater than 70 mg/dL). If your blood sugar is not greater than 70 mg/dL on recheck, call [phone] for further instructions. Report your blood sugar to the short stay nurse when you get to Short Stay.  If you are admitted to the hospital after surgery: Your blood sugar will be checked by the staff and you will probably be given insulin after  surgery (instead of oral diabetes medicines) to make sure you have good blood sugar levels. The goal for blood sugar control after surgery is 80-180 mg/dL.   WHAT DO I DO ABOUT MY DIABETES MEDICATION?  Do not take oral diabetes medicines (pills) the morning of surgery.  Hold Ozempic 7 days before surgery   THE DAY BEFORE SURGERY, take Metformin as prescribed. Take 50% of Insulin glargine      THE MORNING OF SURGERY: do not take Metformin or any insulin    Reviewed and Endorsed by [facility] Patient Education Committee, August 2015                               You may not have any metal on your body including hair pins, jewelry, and body piercing             Do not wear make-up, lotions, powders, perfumes or deodorant  Do not wear nail polish including gel and S&S, artificial/acrylic nails, or any other type of covering on natural nails including finger and toenails. If you have artificial nails, gel coating, etc. that needs to be removed by a nail salon please have this removed prior to surgery or surgery may need to be canceled/ delayed if the surgeon/ anesthesia feels like they are unable to be safely monitored.   Do not shave  48 hours prior to surgery.             Do not bring valuables to the hospital. [place].   Bring small overnight bag day of surgery.   DO NOT [place]. PHARMACY WILL DISPENSE MEDICATIONS LISTED ON YOUR MEDICATION LIST TO YOU DURING YOUR ADMISSION IN  [place]!    Special Instructions: Bring a copy of your healthcare power of attorney and living will documents the day of surgery if you haven't scanned them before.  Please read over the following fact sheets you were given: IF [place]  If you received a COVID test during your pre-op visit  it is requested that you wear a mask when out in public, stay away from  anyone that may not be feeling well and notify your surgeon if you develop symptoms. If you test positive for Covid or have been in contact with anyone that has tested positive in the last 10 days please notify you surgeon.  [place] - Preparing for Surgery Before surgery, you can play an important role.  Because skin is not sterile, your skin needs to be as free of germs as possible.  You can reduce the number of germs on your skin by washing with CHG (chlorahexidine gluconate) soap before surgery.  CHG is an antiseptic cleaner which kills germs and bonds with the skin to continue killing germs even after washing. Please DO NOT use if you have an allergy to CHG or antibacterial soaps.  If your skin becomes reddened/irritated stop using the CHG and inform your nurse when you arrive at Short Stay. Do not shave (including legs and underarms) for at least 48 hours prior to the first CHG shower.  You may shave your face/neck.  Please follow these instructions carefully:  1.  Shower with CHG Soap the night before surgery and the  morning of surgery.  2.  If you choose to wash your hair, wash your hair first as usual with your normal  shampoo.  3.  After you shampoo, rinse your hair and body thoroughly to remove the shampoo.                             4.  Use CHG as you would any other liquid soap.  You can apply chg directly to the skin and wash.  Gently with a scrungie or clean washcloth.  5.  Apply the CHG Soap to your body ONLY FROM THE NECK DOWN.   Do   not use on face/ open                           Wound or open sores. Avoid contact with eyes, ears mouth and   genitals (private parts).                       Wash face,  Genitals (private parts) with your normal soap.             6.  Wash thoroughly, paying special attention to the area where your    surgery  will be performed.  7.  Thoroughly rinse your body with warm water from the neck down.  8.  DO NOT shower/wash with your normal soap after  using and rinsing off the CHG Soap.                9.  Pat yourself dry with a clean towel.            10.  Wear clean pajamas.            11.  Place clean sheets on your bed the night of your first shower  and do not  sleep with pets. Day of Surgery : Do not apply any lotions/deodorants the morning of surgery.  Please wear clean clothes to the hospital/surgery center.  FAILURE TO FOLLOW THESE INSTRUCTIONS MAY RESULT IN THE CANCELLATION OF YOUR SURGERY  PATIENT SIGNATURE_________________________________  NURSE SIGNATURE__________________________________  ________________________________________________________________________     Adam Phenix  An incentive spirometer is a tool that can help keep your lungs clear and active. This tool measures how well you are filling your lungs with each breath. Taking long deep breaths may help reverse or decrease the chance of developing breathing (pulmonary) problems (especially infection) following: A long period of time when you are unable to move or be active. BEFORE THE PROCEDURE  If the spirometer includes an indicator to show your best effort, your nurse or respiratory therapist will set it to a desired goal. If possible, sit up straight or lean slightly forward. Try not to slouch. Hold the incentive spirometer in an upright position. INSTRUCTIONS FOR USE  Sit on the edge of your bed if possible, or sit up as far as you can in bed or on a chair. Hold the incentive spirometer in an upright position. Breathe out normally. Place the mouthpiece in your mouth and seal your lips tightly around it. Breathe in slowly and as deeply as possible, raising the piston or the ball toward the top of the column. Hold your breath for 3-5 seconds or for as long as possible. Allow the piston or ball to fall to the bottom of the column. Remove the mouthpiece from your mouth and breathe out normally. Rest for a few seconds and repeat Steps 1 through 7 at  least 10 times every 1-2 hours when you are awake. Take your time and take a few normal breaths between deep breaths. The spirometer may include an indicator to show your best effort. Use the indicator as a goal to work toward during each repetition. After each set of 10 deep breaths, practice coughing to be sure your lungs are clear. If you have an incision (the cut made at the time of surgery), support your incision when coughing by placing a pillow or rolled up towels firmly against it. Once you are able to get out of bed, walk around indoors and cough well. You may stop using the incentive spirometer when instructed by your caregiver.  RISKS AND COMPLICATIONS Take your time so you do not get dizzy or light-headed. If you are in pain, you may need to take or ask for pain medication before doing incentive spirometry. It is harder to take a deep breath if you are having pain. AFTER USE Rest and breathe slowly and easily. It can be helpful to keep track of a log of your progress. Your caregiver can provide you with a simple table to help with this. If you are using the spirometer at home, follow these instructions: [place] IF:  You are having difficultly using the spirometer. You have trouble using the spirometer as often as instructed. Your pain medication is not giving enough relief while using the spirometer. You develop fever of 100.5 F (38.1 C) or higher. SEEK IMMEDIATE MEDICAL CARE IF:  You cough up bloody sputum that had not been present before. You develop fever of 102 F (38.9 C) or greater. You develop worsening pain at or near the incision site. MAKE SURE YOU:  Understand these instructions. Will watch your condition. Will get help right away if you are not doing well or get  worse. Document Released: 01/31/2007 Document Revised: 12/13/2011 Document Reviewed: 04/03/2007 [facility] Patient Information 2014 [place],  [place].   ________________________________________________________________________

## 2022-06-29 ENCOUNTER — Encounter (HOSPITAL_COMMUNITY): Payer: Self-pay

## 2022-06-29 ENCOUNTER — Encounter (HOSPITAL_COMMUNITY)
Admission: RE | Admit: 2022-06-29 | Discharge: 2022-06-29 | Disposition: A | Discharge status: Home / Self Care | Payer: 59 | Source: Ambulatory Visit | Attending: Orthopedic Surgery | Admitting: Orthopedic Surgery

## 2022-06-29 VITALS — BP 143/88 | HR 75 | Temp 98.4°F | Resp 16 | Ht 63.0 in | Wt 198.8 lb

## 2022-06-29 DIAGNOSIS — Z01818 Encounter for other preprocedural examination: Secondary | ICD-10-CM | POA: Insufficient documentation

## 2022-06-29 DIAGNOSIS — Z794 Long term (current) use of insulin: Secondary | ICD-10-CM | POA: Diagnosis not present

## 2022-06-29 DIAGNOSIS — E119 Type 2 diabetes mellitus without complications: Secondary | ICD-10-CM | POA: Insufficient documentation

## 2022-06-29 HISTORY — DX: Unspecified osteoarthritis, unspecified site: M19.90

## 2022-06-29 HISTORY — DX: Essential (primary) hypertension: I10

## 2022-06-29 HISTORY — DX: Gastro-esophageal reflux disease without esophagitis: K21.9

## 2022-06-29 HISTORY — DX: Pure hypercholesterolemia, unspecified: E78.00

## 2022-06-29 HISTORY — DX: Cardiac arrhythmia, unspecified: I49.9

## 2022-06-29 HISTORY — DX: Malignant (primary) neoplasm, unspecified: C80.1

## 2022-06-29 HISTORY — DX: Personal history of urinary calculi: Z87.442

## 2022-06-29 LAB — CBC
HCT: 37.1 % (ref 36.0–46.0)
Hemoglobin: 12.2 g/dL (ref 12.0–15.0)
MCH: 29.9 pg (ref 26.0–34.0)
MCHC: 32.9 g/dL (ref 30.0–36.0)
MCV: 90.9 fL (ref 80.0–100.0)
Platelets: 267 10*3/uL (ref 150–400)
RBC: 4.08 MIL/uL (ref 3.87–5.11)
RDW: 13.2 % (ref 11.5–15.5)
WBC: 8.9 10*3/uL (ref 4.0–10.5)
nRBC: 0 % (ref 0.0–0.2)

## 2022-06-29 LAB — BASIC METABOLIC PANEL
Anion gap: 6 (ref 5–15)
BUN: 18 mg/dL (ref 6–20)
CO2: 24 mmol/L (ref 22–32)
Calcium: 10.5 mg/dL — ABNORMAL HIGH (ref 8.9–10.3)
Chloride: 105 mmol/L (ref 98–111)
Creatinine, Ser: 0.91 mg/dL (ref 0.44–1.00)
GFR, Estimated: >60 mL/min (ref >60)
Glucose, Bld: 106 mg/dL — ABNORMAL HIGH (ref 70–99)
Potassium: 4 mmol/L (ref 3.5–5.1)
Sodium: 135 mmol/L (ref 135–145)

## 2022-06-29 LAB — SURGICAL PCR SCREEN
MRSA, PCR: NEGATIVE
Staphylococcus aureus: POSITIVE — AB

## 2022-06-29 LAB — HEMOGLOBIN A1C
Hgb A1c MFr Bld: 6.3 % — ABNORMAL HIGH (ref 4.8–5.6)
Mean Plasma Glucose: 134.11 mg/dL

## 2022-06-29 LAB — GLUCOSE, CAPILLARY: Glucose-Capillary: 102 mg/dL — ABNORMAL HIGH (ref 70–99)

## 2022-06-29 NOTE — Progress Notes (Addendum)
Pharmacy unable to come during appointment. Gave patient number to call for medication rec, stated she will call later today.  COVID Vaccine Completed: yes x2  Date of COVID positive in last 90 days: no  PCP - Gaynelle Arabian, MD Cardiologist - n/a  Chest x-ray - n/a EKG - 06/29/22 Epic/chart Stress Test - n/a ECHO - n/a Cardiac Cath - n/a Pacemaker/ICD device last checked: n/a Spinal Cord Stimulator: n/a  Bowel Prep - no  Sleep Study - n/a CPAP -   Fasting Blood Sugar - 90-130 Checks Blood Sugar 1 times a day  Blood Thinner Instructions: Aspirin Instructions: ASA 81, hold 5 days before surgery Last Dose:  Activity level: Can go up a flight of stairs and perform activities of daily living without stopping and without symptoms of chest pain or shortness of breath.  Anesthesia review: report heart "skips a beat", PCP aware per pt  Patient denies shortness of breath, fever, cough and chest pain at PAT appointment  Patient verbalized understanding of instructions that were given to them at the PAT appointment. Patient was also instructed that they will need to review over the PAT instructions again at home before surgery.

## 2022-07-03 NOTE — Anesthesia Preprocedure Evaluation (Signed)
Anesthesia Evaluation  Patient identified by MRN, date of birth, ID band Patient awake    Reviewed: Allergy & Precautions, H&P , NPO status , Patient's Chart, lab work & pertinent test results  Airway Mallampati: I  TM Distance: >3 FB Neck ROM: Full    Dental no notable dental hx. (+) Edentulous Upper, Edentulous Lower, Implants, Upper Dentures, Lower Dentures   Pulmonary neg pulmonary ROS, former smoker,    Pulmonary exam normal breath sounds clear to auscultation       Cardiovascular Exercise Tolerance: Good hypertension, Pt. on medications and Pt. on home beta blockers negative cardio ROS Normal cardiovascular exam Rhythm:Regular Rate:Normal     Neuro/Psych negative neurological ROS  negative psych ROS   GI/Hepatic negative GI ROS, Neg liver ROS, GERD  ,  Endo/Other  negative endocrine ROSdiabetes, Well Controlled, Type 2  Renal/GU negative Renal ROS  negative genitourinary   Musculoskeletal negative musculoskeletal ROS (+)   Abdominal   Peds negative pediatric ROS (+)  Hematology negative hematology ROS (+)   Anesthesia Other Findings   Reproductive/Obstetrics negative OB ROS                            Anesthesia Physical Anesthesia Plan  ASA: 3  Anesthesia Plan: MAC, Regional and Spinal   Post-op Pain Management: Tylenol PO (pre-op)*, Celebrex PO (pre-op)* and Regional block*   Induction: Intravenous  PONV Risk Score and Plan: Propofol infusion and Midazolam  Airway Management Planned: Natural Airway and Nasal Cannula  Additional Equipment: None  Intra-op Plan:   Post-operative Plan:   Informed Consent: I have reviewed the patients History and Physical, chart, labs and discussed the procedure including the risks, benefits and alternatives for the proposed anesthesia with the patient or authorized representative who has indicated his/her understanding and acceptance.        Plan Discussed with: Anesthesiologist and CRNA  Anesthesia Plan Comments: (  )       Anesthesia Quick Evaluation

## 2022-07-05 ENCOUNTER — Other Ambulatory Visit: Payer: Self-pay

## 2022-07-05 ENCOUNTER — Ambulatory Visit (HOSPITAL_COMMUNITY): Payer: 59 | Admitting: Physician Assistant

## 2022-07-05 ENCOUNTER — Encounter (HOSPITAL_COMMUNITY)
Admission: RE | Disposition: A | Discharge status: Home / Self Care | Payer: Self-pay | Source: Home / Self Care | Attending: Orthopedic Surgery

## 2022-07-05 ENCOUNTER — Ambulatory Visit (HOSPITAL_BASED_OUTPATIENT_CLINIC_OR_DEPARTMENT_OTHER): Payer: 59 | Admitting: Anesthesiology

## 2022-07-05 ENCOUNTER — Encounter (HOSPITAL_COMMUNITY): Payer: Self-pay | Admitting: Orthopedic Surgery

## 2022-07-05 ENCOUNTER — Observation Stay (HOSPITAL_COMMUNITY)
Admission: RE | Admit: 2022-07-05 | Discharge: 2022-07-06 | Disposition: A | Discharge status: Home / Self Care | Payer: 59 | Attending: Orthopedic Surgery | Admitting: Orthopedic Surgery

## 2022-07-05 DIAGNOSIS — M179 Osteoarthritis of knee, unspecified: Secondary | ICD-10-CM

## 2022-07-05 DIAGNOSIS — Z87891 Personal history of nicotine dependence: Secondary | ICD-10-CM | POA: Insufficient documentation

## 2022-07-05 DIAGNOSIS — Z79899 Other long term (current) drug therapy: Secondary | ICD-10-CM | POA: Diagnosis not present

## 2022-07-05 DIAGNOSIS — Z7985 Long-term (current) use of injectable non-insulin antidiabetic drugs: Secondary | ICD-10-CM | POA: Insufficient documentation

## 2022-07-05 DIAGNOSIS — E119 Type 2 diabetes mellitus without complications: Secondary | ICD-10-CM

## 2022-07-05 DIAGNOSIS — Z85828 Personal history of other malignant neoplasm of skin: Secondary | ICD-10-CM | POA: Insufficient documentation

## 2022-07-05 DIAGNOSIS — Z7984 Long term (current) use of oral hypoglycemic drugs: Secondary | ICD-10-CM

## 2022-07-05 DIAGNOSIS — Z794 Long term (current) use of insulin: Secondary | ICD-10-CM | POA: Diagnosis not present

## 2022-07-05 DIAGNOSIS — G8918 Other acute postprocedural pain: Secondary | ICD-10-CM | POA: Diagnosis not present

## 2022-07-05 DIAGNOSIS — I10 Essential (primary) hypertension: Secondary | ICD-10-CM | POA: Diagnosis not present

## 2022-07-05 DIAGNOSIS — M1712 Unilateral primary osteoarthritis, left knee: Secondary | ICD-10-CM | POA: Diagnosis not present

## 2022-07-05 HISTORY — PX: TOTAL KNEE ARTHROPLASTY: SHX125

## 2022-07-05 LAB — GLUCOSE, CAPILLARY
Glucose-Capillary: 145 mg/dL — ABNORMAL HIGH (ref 70–99)
Glucose-Capillary: 103 mg/dL — ABNORMAL HIGH (ref 70–99)
Glucose-Capillary: 152 mg/dL — ABNORMAL HIGH (ref 70–99)
Glucose-Capillary: 274 mg/dL — ABNORMAL HIGH (ref 70–99)
Glucose-Capillary: 252 mg/dL — ABNORMAL HIGH (ref 70–99)

## 2022-07-05 LAB — HEMOGLOBIN A1C
Hgb A1c MFr Bld: 6 % — ABNORMAL HIGH (ref 4.8–5.6)
Mean Plasma Glucose: 125.5 mg/dL

## 2022-07-05 SURGERY — ARTHROPLASTY, KNEE, TOTAL
Anesthesia: Monitor Anesthesia Care | Site: Knee | Laterality: Left

## 2022-07-05 MED ORDER — SODIUM CHLORIDE 0.9 % IR SOLN
Status: DC | PRN
  Administered 2022-07-05: 1000 mL

## 2022-07-05 MED ORDER — MEPERIDINE HCL 50 MG/ML IJ SOLN: 6.2500 mg | INTRAMUSCULAR | Status: DC | PRN

## 2022-07-05 MED ORDER — BUPIVACAINE LIPOSOME 1.3 % IJ SUSP
INTRAMUSCULAR | Status: AC
  Filled 2022-07-05: qty 20

## 2022-07-05 MED ORDER — METOCLOPRAMIDE HCL 5 MG/ML IJ SOLN: 5.0000 mg | Freq: Three times a day (TID) | INTRAMUSCULAR | Status: DC | PRN

## 2022-07-05 MED ORDER — DOCUSATE SODIUM 100 MG PO CAPS
100.0000 mg | ORAL_CAPSULE | Freq: Two times a day (BID) | ORAL | Status: DC
  Administered 2022-07-05 – 2022-07-06 (×2): 100 mg via ORAL
  Filled 2022-07-05 (×2): qty 1

## 2022-07-05 MED ORDER — DEXAMETHASONE SODIUM PHOSPHATE 10 MG/ML IJ SOLN
INTRAMUSCULAR | Status: AC
  Filled 2022-07-05: qty 1

## 2022-07-05 MED ORDER — SODIUM CHLORIDE (PF) 0.9 % IJ SOLN
INTRAMUSCULAR | Status: AC
  Filled 2022-07-05: qty 10

## 2022-07-05 MED ORDER — OXYCODONE HCL 5 MG PO TABS: 5.0000 mg | ORAL_TABLET | Freq: Once | ORAL | Status: DC | PRN

## 2022-07-05 MED ORDER — DEXAMETHASONE SODIUM PHOSPHATE 10 MG/ML IJ SOLN
INTRAMUSCULAR | Status: DC | PRN
  Administered 2022-07-05: 10 mg

## 2022-07-05 MED ORDER — TRAMADOL HCL 50 MG PO TABS
50.0000 mg | ORAL_TABLET | Freq: Four times a day (QID) | ORAL | Status: DC | PRN
  Administered 2022-07-06: 50 mg via ORAL
  Filled 2022-07-05: qty 1

## 2022-07-05 MED ORDER — PHENYLEPHRINE 80 MCG/ML (10ML) SYRINGE FOR IV PUSH (FOR BLOOD PRESSURE SUPPORT)
PREFILLED_SYRINGE | INTRAVENOUS | Status: AC
  Filled 2022-07-05: qty 10

## 2022-07-05 MED ORDER — DEXAMETHASONE SODIUM PHOSPHATE 10 MG/ML IJ SOLN
INTRAMUSCULAR | Status: DC | PRN
  Administered 2022-07-05: 5 mg via INTRAVENOUS

## 2022-07-05 MED ORDER — FENTANYL CITRATE PF 50 MCG/ML IJ SOSY: 25.0000 ug | PREFILLED_SYRINGE | INTRAMUSCULAR | Status: DC | PRN

## 2022-07-05 MED ORDER — PHENOL 1.4 % MT LIQD: 1.0000 | OROMUCOSAL | Status: DC | PRN

## 2022-07-05 MED ORDER — PANTOPRAZOLE SODIUM 40 MG PO TBEC
80.0000 mg | DELAYED_RELEASE_TABLET | Freq: Every day | ORAL | Status: DC
  Administered 2022-07-05: 80 mg via ORAL
  Filled 2022-07-05: qty 2

## 2022-07-05 MED ORDER — CEFAZOLIN SODIUM-DEXTROSE 2-4 GM/100ML-% IV SOLN
2.0000 g | INTRAVENOUS | Status: AC
  Administered 2022-07-05: 2 g via INTRAVENOUS
  Filled 2022-07-05: qty 100

## 2022-07-05 MED ORDER — ROPIVACAINE HCL 7.5 MG/ML IJ SOLN
INTRAMUSCULAR | Status: DC | PRN
  Administered 2022-07-05: 20 mL via PERINEURAL

## 2022-07-05 MED ORDER — LIDOCAINE 2% (20 MG/ML) 5 ML SYRINGE
INTRAMUSCULAR | Status: DC | PRN
  Administered 2022-07-05: 80 mg via INTRAVENOUS

## 2022-07-05 MED ORDER — BUPIVACAINE IN DEXTROSE 0.75-8.25 % IT SOLN
INTRATHECAL | Status: DC | PRN
  Administered 2022-07-05: 1.8 mL via INTRATHECAL

## 2022-07-05 MED ORDER — METHOCARBAMOL 500 MG PO TABS
500.0000 mg | ORAL_TABLET | Freq: Four times a day (QID) | ORAL | Status: DC | PRN
  Administered 2022-07-05 (×2): 500 mg via ORAL
  Filled 2022-07-05 (×2): qty 1

## 2022-07-05 MED ORDER — SODIUM CHLORIDE (PF) 0.9 % IJ SOLN
INTRAMUSCULAR | Status: AC
  Filled 2022-07-05: qty 50

## 2022-07-05 MED ORDER — TRANEXAMIC ACID-NACL 1000-0.7 MG/100ML-% IV SOLN
1000.0000 mg | INTRAVENOUS | Status: AC
  Administered 2022-07-05: 1000 mg via INTRAVENOUS
  Filled 2022-07-05: qty 100

## 2022-07-05 MED ORDER — ORAL CARE MOUTH RINSE: 15.0000 mL | Freq: Once | OROMUCOSAL | Status: AC

## 2022-07-05 MED ORDER — METHOCARBAMOL 500 MG IVPB - SIMPLE MED: 500.0000 mg | Freq: Four times a day (QID) | INTRAVENOUS | Status: DC | PRN

## 2022-07-05 MED ORDER — LACTATED RINGERS IV SOLN: INTRAVENOUS | Status: DC

## 2022-07-05 MED ORDER — INSULIN ASPART 100 UNIT/ML IJ SOLN
0.0000 [IU] | Freq: Three times a day (TID) | INTRAMUSCULAR | Status: DC
  Administered 2022-07-05: 8 [IU] via SUBCUTANEOUS
  Administered 2022-07-06: 3 [IU] via SUBCUTANEOUS
  Administered 2022-07-06: 5 [IU] via SUBCUTANEOUS

## 2022-07-05 MED ORDER — FLEET ENEMA 7-19 GM/118ML RE ENEM: 1.0000 | ENEMA | Freq: Once | RECTAL | Status: DC | PRN

## 2022-07-05 MED ORDER — POLYETHYLENE GLYCOL 3350 17 G PO PACK: 17.0000 g | PACK | Freq: Every day | ORAL | Status: DC | PRN

## 2022-07-05 MED ORDER — SODIUM CHLORIDE (PF) 0.9 % IJ SOLN
INTRAMUSCULAR | Status: DC | PRN
  Administered 2022-07-05: 60 mL

## 2022-07-05 MED ORDER — METOCLOPRAMIDE HCL 5 MG PO TABS: 5.0000 mg | ORAL_TABLET | Freq: Three times a day (TID) | ORAL | Status: DC | PRN

## 2022-07-05 MED ORDER — GABAPENTIN 300 MG PO CAPS
300.0000 mg | ORAL_CAPSULE | Freq: Three times a day (TID) | ORAL | Status: DC
  Administered 2022-07-05 – 2022-07-06 (×3): 300 mg via ORAL
  Filled 2022-07-05 (×3): qty 1

## 2022-07-05 MED ORDER — DEXAMETHASONE SODIUM PHOSPHATE 10 MG/ML IJ SOLN: 8.0000 mg | Freq: Once | INTRAMUSCULAR | Status: DC

## 2022-07-05 MED ORDER — EPHEDRINE 5 MG/ML INJ
INTRAVENOUS | Status: AC
  Filled 2022-07-05: qty 5

## 2022-07-05 MED ORDER — ACETAMINOPHEN 10 MG/ML IV SOLN
1000.0000 mg | Freq: Four times a day (QID) | INTRAVENOUS | Status: DC
  Administered 2022-07-05: 1000 mg via INTRAVENOUS
  Filled 2022-07-05: qty 100

## 2022-07-05 MED ORDER — CHLORHEXIDINE GLUCONATE 0.12 % MT SOLN
15.0000 mL | Freq: Once | OROMUCOSAL | Status: AC
  Administered 2022-07-05: 15 mL via OROMUCOSAL

## 2022-07-05 MED ORDER — FENTANYL CITRATE PF 50 MCG/ML IJ SOSY
50.0000 ug | PREFILLED_SYRINGE | INTRAMUSCULAR | Status: DC
  Administered 2022-07-05: 50 ug via INTRAVENOUS
  Filled 2022-07-05: qty 2

## 2022-07-05 MED ORDER — LIDOCAINE HCL (PF) 2 % IJ SOLN
INTRAMUSCULAR | Status: AC
  Filled 2022-07-05: qty 5

## 2022-07-05 MED ORDER — PHENYLEPHRINE 80 MCG/ML (10ML) SYRINGE FOR IV PUSH (FOR BLOOD PRESSURE SUPPORT)
PREFILLED_SYRINGE | INTRAVENOUS | Status: DC | PRN
  Administered 2022-07-05: 80 ug via INTRAVENOUS

## 2022-07-05 MED ORDER — POVIDONE-IODINE 10 % EX SWAB
2.0000 | Freq: Once | CUTANEOUS | Status: AC
  Administered 2022-07-05: 2 via TOPICAL

## 2022-07-05 MED ORDER — BUPIVACAINE LIPOSOME 1.3 % IJ SUSP
INTRAMUSCULAR | Status: DC | PRN
  Administered 2022-07-05: 20 mL

## 2022-07-05 MED ORDER — ACETAMINOPHEN 160 MG/5ML PO SOLN: 325.0000 mg | ORAL | Status: DC | PRN

## 2022-07-05 MED ORDER — MIDAZOLAM HCL 2 MG/2ML IJ SOLN
1.0000 mg | INTRAMUSCULAR | Status: DC
  Administered 2022-07-05: 2 mg via INTRAVENOUS
  Filled 2022-07-05: qty 2

## 2022-07-05 MED ORDER — PROPOFOL 500 MG/50ML IV EMUL
INTRAVENOUS | Status: DC | PRN
  Administered 2022-07-05: 75 ug/kg/min via INTRAVENOUS
  Administered 2022-07-05: 40 mg via INTRAVENOUS

## 2022-07-05 MED ORDER — INSULIN ASPART 100 UNIT/ML IJ SOLN
0.0000 [IU] | Freq: Every day | INTRAMUSCULAR | Status: DC
  Administered 2022-07-05: 3 [IU] via SUBCUTANEOUS

## 2022-07-05 MED ORDER — MORPHINE SULFATE (PF) 2 MG/ML IV SOLN
1.0000 mg | INTRAVENOUS | Status: DC | PRN
  Administered 2022-07-05 (×2): 2 mg via INTRAVENOUS
  Filled 2022-07-05 (×2): qty 1

## 2022-07-05 MED ORDER — ASPIRIN 325 MG PO TBEC
325.0000 mg | DELAYED_RELEASE_TABLET | Freq: Two times a day (BID) | ORAL | Status: DC
  Administered 2022-07-06: 325 mg via ORAL
  Filled 2022-07-05: qty 1

## 2022-07-05 MED ORDER — ALPRAZOLAM 0.25 MG PO TABS
0.2500 mg | ORAL_TABLET | Freq: Every day | ORAL | Status: DC
  Administered 2022-07-05: 0.25 mg via ORAL
  Filled 2022-07-05: qty 1

## 2022-07-05 MED ORDER — ONDANSETRON HCL 4 MG PO TABS: 4.0000 mg | ORAL_TABLET | Freq: Four times a day (QID) | ORAL | Status: DC | PRN

## 2022-07-05 MED ORDER — BISACODYL 10 MG RE SUPP: 10.0000 mg | Freq: Every day | RECTAL | Status: DC | PRN

## 2022-07-05 MED ORDER — SODIUM CHLORIDE 0.9 % IV SOLN: INTRAVENOUS | Status: DC

## 2022-07-05 MED ORDER — BUPIVACAINE LIPOSOME 1.3 % IJ SUSP: 20.0000 mL | Freq: Once | INTRAMUSCULAR | Status: DC

## 2022-07-05 MED ORDER — ONDANSETRON HCL 4 MG/2ML IJ SOLN: 4.0000 mg | Freq: Once | INTRAMUSCULAR | Status: DC | PRN

## 2022-07-05 MED ORDER — ACETAMINOPHEN 500 MG PO TABS
1000.0000 mg | ORAL_TABLET | Freq: Four times a day (QID) | ORAL | Status: AC
  Administered 2022-07-05 – 2022-07-06 (×4): 1000 mg via ORAL
  Filled 2022-07-05 (×4): qty 2

## 2022-07-05 MED ORDER — ONDANSETRON HCL 4 MG/2ML IJ SOLN
4.0000 mg | Freq: Four times a day (QID) | INTRAMUSCULAR | Status: DC | PRN
  Administered 2022-07-05: 4 mg via INTRAVENOUS
  Filled 2022-07-05: qty 2

## 2022-07-05 MED ORDER — 0.9 % SODIUM CHLORIDE (POUR BTL) OPTIME
TOPICAL | Status: DC | PRN
  Administered 2022-07-05: 1000 mL

## 2022-07-05 MED ORDER — ACETAMINOPHEN 325 MG PO TABS: 325.0000 mg | ORAL_TABLET | ORAL | Status: DC | PRN

## 2022-07-05 MED ORDER — LORATADINE 10 MG PO TABS
10.0000 mg | ORAL_TABLET | Freq: Every day | ORAL | Status: DC
  Administered 2022-07-06: 10 mg via ORAL
  Filled 2022-07-05: qty 1

## 2022-07-05 MED ORDER — PROPOFOL 1000 MG/100ML IV EMUL
INTRAVENOUS | Status: AC
  Filled 2022-07-05: qty 100

## 2022-07-05 MED ORDER — ROSUVASTATIN CALCIUM 5 MG PO TABS: 5.0000 mg | ORAL_TABLET | Freq: Every day | ORAL | Status: DC

## 2022-07-05 MED ORDER — MENTHOL 3 MG MT LOZG: 1.0000 | LOZENGE | OROMUCOSAL | Status: DC | PRN

## 2022-07-05 MED ORDER — LACTATED RINGERS IV SOLN
INTRAVENOUS | Status: DC
Start: 2022-07-05 — End: 2022-07-05

## 2022-07-05 MED ORDER — OXYCODONE HCL 5 MG PO TABS
5.0000 mg | ORAL_TABLET | ORAL | Status: DC | PRN
  Administered 2022-07-05: 10 mg via ORAL
  Administered 2022-07-05: 5 mg via ORAL
  Administered 2022-07-05 – 2022-07-06 (×3): 10 mg via ORAL
  Filled 2022-07-05: qty 1
  Filled 2022-07-05 (×4): qty 2

## 2022-07-05 MED ORDER — ATENOLOL 50 MG PO TABS
100.0000 mg | ORAL_TABLET | Freq: Every day | ORAL | Status: DC
  Administered 2022-07-05: 100 mg via ORAL
  Filled 2022-07-05: qty 2

## 2022-07-05 MED ORDER — OXYCODONE HCL 5 MG/5ML PO SOLN: 5.0000 mg | Freq: Once | ORAL | Status: DC | PRN

## 2022-07-05 MED ORDER — CEFAZOLIN SODIUM-DEXTROSE 2-4 GM/100ML-% IV SOLN
2.0000 g | Freq: Four times a day (QID) | INTRAVENOUS | Status: AC
  Administered 2022-07-05 (×2): 2 g via INTRAVENOUS
  Filled 2022-07-05 (×2): qty 100

## 2022-07-05 MED ORDER — ONDANSETRON HCL 4 MG/2ML IJ SOLN
INTRAMUSCULAR | Status: AC
  Filled 2022-07-05: qty 2

## 2022-07-05 MED ORDER — DIPHENHYDRAMINE HCL 12.5 MG/5ML PO ELIX
12.5000 mg | ORAL_SOLUTION | ORAL | Status: DC | PRN
  Administered 2022-07-05: 25 mg via ORAL
  Filled 2022-07-05: qty 10

## 2022-07-05 SURGICAL SUPPLY — 56 items
ATTUNE MED DOME PAT 38 KNEE (Knees) IMPLANT
ATTUNE PSFEM LTSZ5 NARCEM KNEE (Femur) IMPLANT
ATTUNE PSRP INSR SZ5 6 KNEE (Insert) IMPLANT
BAG COUNTER SPONGE SURGICOUNT (BAG) IMPLANT
BAG SPEC THK2 15X12 ZIP CLS (MISCELLANEOUS) ×1
BAG SPNG CNTER NS LX DISP (BAG) ×1
BAG ZIPLOCK 12X15 (MISCELLANEOUS) ×1 IMPLANT
BASE TIBIAL ROT PLAT SZ 5 KNEE (Knees) IMPLANT
BLADE SAG 18X100X1.27 (BLADE) ×1 IMPLANT
BLADE SAW SGTL 11.0X1.19X90.0M (BLADE) ×1 IMPLANT
BNDG ELASTIC 6X5.8 VLCR STR LF (GAUZE/BANDAGES/DRESSINGS) ×1 IMPLANT
BOWL SMART MIX CTS (DISPOSABLE) ×1 IMPLANT
BSPLAT TIB 5 CMNT ROT PLAT STR (Knees) ×1 IMPLANT
CEMENT HV SMART SET (Cement) ×2 IMPLANT
COVER SURGICAL LIGHT HANDLE (MISCELLANEOUS) ×1 IMPLANT
CUFF TOURN SGL QUICK 34 (TOURNIQUET CUFF) ×1
CUFF TRNQT CYL 34X4.125X (TOURNIQUET CUFF) ×1 IMPLANT
DRAPE INCISE IOBAN 66X45 STRL (DRAPES) ×1 IMPLANT
DRAPE U-SHAPE 47X51 STRL (DRAPES) ×1 IMPLANT
DRSG AQUACEL AG ADV 3.5X10 (GAUZE/BANDAGES/DRESSINGS) ×1 IMPLANT
DURAPREP 26ML APPLICATOR (WOUND CARE) ×1 IMPLANT
ELECT REM PT RETURN 15FT ADLT (MISCELLANEOUS) ×1 IMPLANT
GLOVE BIO SURGEON STRL SZ 6.5 (GLOVE) IMPLANT
GLOVE BIO SURGEON STRL SZ7.5 (GLOVE) IMPLANT
GLOVE BIO SURGEON STRL SZ8 (GLOVE) ×1 IMPLANT
GLOVE BIOGEL PI IND STRL 6.5 (GLOVE) IMPLANT
GLOVE BIOGEL PI IND STRL 7.0 (GLOVE) IMPLANT
GLOVE BIOGEL PI IND STRL 8 (GLOVE) ×1 IMPLANT
GOWN STRL REUS W/ TWL LRG LVL3 (GOWN DISPOSABLE) ×1 IMPLANT
GOWN STRL REUS W/ TWL XL LVL3 (GOWN DISPOSABLE) IMPLANT
GOWN STRL REUS W/TWL LRG LVL3 (GOWN DISPOSABLE) ×1
GOWN STRL REUS W/TWL XL LVL3 (GOWN DISPOSABLE) ×1
HANDPIECE INTERPULSE COAX TIP (DISPOSABLE) ×1
HOLDER FOLEY CATH W/STRAP (MISCELLANEOUS) IMPLANT
IMMOBILIZER KNEE 20 (SOFTGOODS) ×1
IMMOBILIZER KNEE 20 THIGH 36 (SOFTGOODS) ×1 IMPLANT
KIT TURNOVER KIT A (KITS) IMPLANT
MANIFOLD NEPTUNE II (INSTRUMENTS) ×1 IMPLANT
NS IRRIG 1000ML POUR BTL (IV SOLUTION) ×1 IMPLANT
PACK TOTAL KNEE CUSTOM (KITS) ×1 IMPLANT
PADDING CAST COTTON 6X4 STRL (CAST SUPPLIES) ×2 IMPLANT
PROTECTOR NERVE ULNAR (MISCELLANEOUS) ×1 IMPLANT
SET HNDPC FAN SPRY TIP SCT (DISPOSABLE) ×1 IMPLANT
SPIKE FLUID TRANSFER (MISCELLANEOUS) ×1 IMPLANT
STRIP CLOSURE SKIN 1/2X4 (GAUZE/BANDAGES/DRESSINGS) ×2 IMPLANT
SUT MNCRL AB 4-0 PS2 18 (SUTURE) ×1 IMPLANT
SUT STRATAFIX 0 PDS 27 VIOLET (SUTURE) ×1
SUT VIC AB 2-0 CT1 27 (SUTURE) ×3
SUT VIC AB 2-0 CT1 TAPERPNT 27 (SUTURE) ×3 IMPLANT
SUTURE STRATFX 0 PDS 27 VIOLET (SUTURE) ×1 IMPLANT
TIBIAL BASE ROT PLAT SZ 5 KNEE (Knees) ×1 IMPLANT
TRAY FOLEY MTR SLVR 14FR STAT (SET/KITS/TRAYS/PACK) IMPLANT
TRAY FOLEY MTR SLVR 16FR STAT (SET/KITS/TRAYS/PACK) ×1 IMPLANT
TUBE SUCTION HIGH CAP CLEAR NV (SUCTIONS) ×1 IMPLANT
WATER STERILE IRR 1000ML POUR (IV SOLUTION) ×2 IMPLANT
WRAP KNEE MAXI GEL POST OP (GAUZE/BANDAGES/DRESSINGS) ×1 IMPLANT

## 2022-07-05 NOTE — Anesthesia Procedure Notes (Signed)
Anesthesia Regional Block: Adductor canal block   Pre-Anesthetic Checklist: , timeout performed,  Correct Patient, Correct Site, Correct Laterality,  Correct Procedure, Correct Position, site marked,  Risks and benefits discussed,  Surgical consent,  Pre-op evaluation,  At surgeon's request and post-op pain management  Laterality: Left  Prep: chloraprep       Needles:  Injection technique: Single-shot  Needle Type: Echogenic Stimulator Needle     Needle Length: 5cm  Needle Gauge: 22     Additional Needles:   Procedures:,,,, ultrasound used (permanent image in chart),,    Narrative:  Start time: 07/05/2022 7:40 AM End time: 07/05/2022 7:46 AM Injection made incrementally with aspirations every 5 mL.  Performed by: Personally  Anesthesiologist: Janeece Riggers, MD  Additional Notes: Functioning IV was confirmed and monitors were applied.  A 33m 22ga Arrow echogenic stimulator needle was used. Sterile prep and drape,hand hygiene and sterile gloves were used. Ultrasound guidance: relevant anatomy identified, needle position confirmed, local anesthetic spread visualized around nerve(s)., vascular puncture avoided.  Image printed for medical record. Negative aspiration and negative test dose prior to incremental administration of local anesthetic. The patient tolerated the procedure well.

## 2022-07-05 NOTE — Op Note (Signed)
OPERATIVE REPORT-TOTAL KNEE ARTHROPLASTY   Pre-operative diagnosis- Osteoarthritis  Left knee(s)  Post-operative diagnosis- Osteoarthritis Left knee(s)  Procedure-  Left  Total Knee Arthroplasty  Surgeon- Dione Plover. Doroteo Nickolson, MD  Assistant- Molli Barrows, PA-C   Anesthesia-   Adductor canal block and spinal  EBL-40 mL   Drains None  Tourniquet time-  Total Tourniquet Time Documented: Thigh (Left) - 34 minutes Total: Thigh (Left) - 34 minutes     Complications- None  Condition-PACU - hemodynamically stable.   Brief Clinical Note  Shelley Dixon is a 61 y.o. year old female with end stage OA of her left knee with progressively worsening pain and dysfunction. She has constant pain, with activity and at rest and significant functional deficits with difficulties even with ADLs. She has had extensive non-op management including analgesics, injections of cortisone and viscosupplements, and home exercise program, but remains in significant pain with significant dysfunction. Radiographs show bone on bone arthritis lateral and patellofemoral. She presents now for left Total Knee Arthroplasty.     Procedure in detail---   The patient is brought into the operating room and positioned supine on the operating table. After successful administration of  Adductor canal block and spinal,   a tourniquet is placed high on the  Left thigh(s) and the lower extremity is prepped and draped in the usual sterile fashion. Time out is performed by the operating team and then the  Left lower extremity is wrapped in Esmarch, knee flexed and the tourniquet inflated to 300 mmHg.       A midline incision is made with a ten blade through the subcutaneous tissue to the level of the extensor mechanism. A fresh blade is used to make a medial parapatellar arthrotomy. Soft tissue over the proximal medial tibia is subperiosteally elevated to the joint line with a knife and into the semimembranosus bursa with a Cobb  elevator. Soft tissue over the proximal lateral tibia is elevated with attention being paid to avoiding the patellar tendon on the tibial tubercle. The patella is everted, knee flexed 90 degrees and the ACL and PCL are removed. Findings are bone on bone lateral and patellofemoral with large global osteophytes.        The drill is used to create a starting hole in the distal femur and the canal is thoroughly irrigated with sterile saline to remove the fatty contents. The 5 degree Left  valgus alignment guide is placed into the femoral canal and the distal femoral cutting block is pinned to remove 9 mm off the distal femur. Resection is made with an oscillating saw.      The tibia is subluxed forward and the menisci are removed. The extramedullary alignment guide is placed referencing proximally at the medial aspect of the tibial tubercle and distally along the second metatarsal axis and tibial crest. The block is pinned to remove 85m off the more deficient lateral  side. Resection is made with an oscillating saw. Size 5is the most appropriate size for the tibia and the proximal tibia is prepared with the modular drill and keel punch for that size.      The femoral sizing guide is placed and size 5 is most appropriate. Rotation is marked off the epicondylar axis and confirmed by creating a rectangular flexion gap at 90 degrees. The size 5 cutting block is pinned in this rotation and the anterior, posterior and chamfer cuts are made with the oscillating saw. The intercondylar block is then placed and that cut is  made.      Trial size 5 tibial component, trial size 5 narrow posterior stabilized femur and a 6  mm posterior stabilized rotating platform insert trial is placed. Full extension is achieved with excellent varus/valgus and anterior/posterior balance throughout full range of motion. The patella is everted and thickness measured to be 22  mm. Free hand resection is taken to 12 mm, a 38 template is placed, lug  holes are drilled, trial patella is placed, and it tracks normally. Osteophytes are removed off the posterior femur with the trial in place. All trials are removed and the cut bone surfaces prepared with pulsatile lavage. Cement is mixed and once ready for implantation, the size 5 tibial implant, size  5 narrow posterior stabilized femoral component, and the size 38 patella are cemented in place and the patella is held with the clamp. The trial insert is placed and the knee held in full extension. The Exparel (20 ml mixed with 60 ml saline) is injected into the extensor mechanism, posterior capsule, medial and lateral gutters and subcutaneous tissues.  All extruded cement is removed and once the cement is hard the permanent 6 mm posterior stabilized rotating platform insert is placed into the tibial tray.      The wound is copiously irrigated with saline solution and the extensor mechanism closed with # 0 Stratofix suture. The tourniquet is released for a total tourniquet time of 34  minutes. Flexion against gravity is 140 degrees and the patella tracks normally. Subcutaneous tissue is closed with 2.0 vicryl and subcuticular with running 4.0 Monocryl. The incision is cleaned and dried and steri-strips and a bulky sterile dressing are applied. The limb is placed into a knee immobilizer and the patient is awakened and transported to recovery in stable condition.      Please note that a surgical assistant was a medical necessity for this procedure in order to perform it in a safe and expeditious manner. Surgical assistant was necessary to retract the ligaments and vital neurovascular structures to prevent injury to them and also necessary for proper positioning of the limb to allow for anatomic placement of the prosthesis.   Dione Plover Bernece Gall, MD    07/05/2022, 9:26 AM

## 2022-07-05 NOTE — Anesthesia Procedure Notes (Signed)
Spinal  Patient location during procedure: OR Start time: 07/05/2022 8:15 AM End time: 07/05/2022 8:19 AM Reason for block: surgical anesthesia Staffing Anesthesiologist: Janeece Riggers, MD Performed by: Janeece Riggers, MD Authorized by: Janeece Riggers, MD   Preanesthetic Checklist Completed: patient identified, IV checked, site marked, risks and benefits discussed, surgical consent, monitors and equipment checked, pre-op evaluation and timeout performed Spinal Block Patient position: sitting Prep: DuraPrep Patient monitoring: heart rate, cardiac monitor, continuous pulse ox and blood pressure Approach: midline Location: L3-4 Injection technique: single-shot Needle Needle type: Sprotte  Needle gauge: 24 G Needle length: 9 cm Assessment Sensory level: T4 Events: CSF return

## 2022-07-05 NOTE — Discharge Instructions (Signed)
Shelley Arabian, MD Total Joint Specialist [facility] [address] [place], [place] [address] [phone]  TOTAL KNEE REPLACEMENT POSTOPERATIVE DIRECTIONS    Knee Rehabilitation, Guidelines Following Surgery  Results after knee surgery are often greatly improved when you follow the exercise, range of motion and muscle strengthening exercises prescribed by your doctor. Safety measures are also important to protect the knee from further injury. If any of these exercises cause you to have increased pain or swelling in your knee joint, decrease the amount until you are comfortable again and slowly increase them. If you have problems or questions, call your caregiver or physical therapist for advice.   BLOOD CLOT PREVENTION Take a 325 mg Aspirin two times a day for three weeks following surgery. Then take an 81 mg Aspirin once a day for three weeks. Then discontinue Aspirin. You may resume your vitamins/supplements upon discharge from the hospital. Do not take any NSAIDs (Advil, Aleve, Ibuprofen, Meloxicam, etc.) until you have discontinued the 325 mg Aspirin.  GABAPENTIN INSTRUCTIONS Take a 300 mg capsule three times a day for two weeks following surgery.Then take a 300 mg capsule two times a day for two weeks. Then resume a 300 mg capsule once a day  [place] items at home which could result in a fall. This includes throw rugs or furniture in walking pathways.  ICE to the affected knee as much as tolerated. Icing helps control swelling. If the swelling is well controlled you will be more comfortable and rehab easier. Continue to use ice on the knee for pain and swelling from surgery. You may notice swelling that will progress down to the foot and ankle. This is normal after surgery. Elevate the leg when you are not up walking on it.    Continue to use the breathing machine which will help keep your temperature down. It is common for your  temperature to cycle up and down following surgery, especially at night when you are not up moving around and exerting yourself. The breathing machine keeps your lungs expanded and your temperature down. Do not place pillow under the operative knee, focus on keeping the knee straight while resting  DIET You may resume your previous home diet once you are discharged from the hospital.  DRESSING / WOUND CARE / SHOWERING Keep your bulky bandage on for 2 days. On the third post-operative day you may remove the Ace bandage and gauze. There is a waterproof adhesive bandage on your skin which will stay in place until your first follow-up appointment. Once you remove this you will not need to place another bandage You may begin showering 3 days following surgery, but do not submerge the incision under water.  ACTIVITY For the first 5 days, the key is rest and control of pain and swelling Do your home exercises twice a day starting on post-operative day 3. On the days you go to physical therapy, just do the home exercises once that day. You should rest, ice and elevate the leg for 50 minutes out of every hour. Get up and walk/stretch for 10 minutes per hour. After 5 days you can increase your activity slowly as tolerated. Walk with your walker as instructed. Use the walker until you are comfortable transitioning to a cane. Walk with the cane in the opposite hand of the operative leg. You may discontinue the cane once you are comfortable and walking steadily. Avoid periods of inactivity such as sitting longer than an  hour when not asleep. This helps prevent blood clots.  You may discontinue the knee immobilizer once you are able to perform a straight leg raise while lying down. You may resume a sexual relationship in one month or when given the OK by your doctor.  You may return to work once you are cleared by your doctor.  Do not drive a car for 6 weeks or until released by your surgeon.  Do not drive  while taking narcotics.  TED HOSE STOCKINGS Wear the elastic stockings on both legs for three weeks following surgery during the day. You may remove them at night for sleeping.  WEIGHT BEARING Weight bearing as tolerated with assist device (walker, cane, etc) as directed, use it as long as suggested by your surgeon or therapist, typically at least 4-6 weeks.  POSTOPERATIVE CONSTIPATION PROTOCOL Constipation - defined medically as fewer than three stools per week and severe constipation as less than one stool per week.  One of the most common issues patients have following surgery is constipation.  Even if you have a regular bowel pattern at home, your normal regimen is likely to be disrupted due to multiple reasons following surgery.  Combination of anesthesia, postoperative narcotics, change in appetite and fluid intake all can affect your bowels.  In order to avoid complications following surgery, here are some recommendations in order to help you during your recovery period.  Colace (docusate) - Pick up an over-the-counter form of Colace or another stool softener and take twice a day as long as you are requiring postoperative pain medications.  Take with a full glass of water daily.  If you experience loose stools or diarrhea, hold the colace until you stool forms back up. If your symptoms do not get better within 1 week or if they get worse, check with your doctor. Dulcolax (bisacodyl) - Pick up over-the-counter and take as directed by the product packaging as needed to assist with the movement of your bowels.  Take with a full glass of water.  Use this product as needed if not relieved by Colace only.  MiraLax (polyethylene glycol) - Pick up over-the-counter to have on hand. MiraLax is a solution that will increase the amount of water in your bowels to assist with bowel movements.  Take as directed and can mix with a glass of water, juice, soda, coffee, or tea. Take if you go more than two days  without a movement. Do not use MiraLax more than once per day. Call your doctor if you are still constipated or irregular after using this medication for 7 days in a row.  If you continue to have problems with postoperative constipation, please contact the office for further assistance and recommendations.  If you experience "the worst abdominal pain ever" or develop nausea or vomiting, please contact the office immediatly for further recommendations for treatment.  ITCHING If you experience itching with your medications, try taking only a single pain pill, or even half a pain pill at a time.  You can also use Benadryl over the counter for itching or also to help with sleep.   MEDICATIONS See your medication summary on the "After Visit Summary" that the nursing staff will review with you prior to discharge.  You may have some home medications which will be placed on hold until you complete the course of blood thinner medication.  It is important for you to complete the blood thinner medication as prescribed by your surgeon.  Continue your approved medications  as instructed at time of discharge.  PRECAUTIONS If you experience chest pain or shortness of breath - call 911 immediately for transfer to the hospital emergency department.  If you develop a fever greater that 101 F, purulent drainage from wound, increased redness or drainage from wound, foul odor from the wound/dressing, or calf pain - CONTACT YOUR SURGEON.                                                   FOLLOW-UP APPOINTMENTS Make sure you keep all of your appointments after your operation with your surgeon and caregivers. You should call the office at the above phone number and make an appointment for approximately two weeks after the date of your surgery or on the date instructed by your surgeon outlined in the "After Visit Summary".  RANGE OF MOTION AND STRENGTHENING EXERCISES  Rehabilitation of the knee is important following a knee  injury or an operation. After just a few days of immobilization, the muscles of the thigh which control the knee become weakened and shrink (atrophy). Knee exercises are designed to build up the tone and strength of the thigh muscles and to improve knee motion. Often times heat used for twenty to thirty minutes before working out will loosen up your tissues and help with improving the range of motion but do not use heat for the first two weeks following surgery. These exercises can be done on a training (exercise) mat, on the floor, on a table or on a bed. Use what ever works the best and is most comfortable for you Knee exercises include:  Leg Lifts - While your knee is still immobilized in a splint or cast, you can do straight leg raises. Lift the leg to 60 degrees, hold for 3 sec, and slowly lower the leg. Repeat 10-20 times 2-3 times daily. Perform this exercise against resistance later as your knee gets better.  Quad and Hamstring Sets - Tighten up the muscle on the front of the thigh (Quad) and hold for 5-10 sec. Repeat this 10-20 times hourly. Hamstring sets are done by pushing the foot backward against an object and holding for 5-10 sec. Repeat as with quad sets.  Leg Slides: Lying on your back, slowly slide your foot toward your buttocks, bending your knee up off the floor (only go as far as is comfortable). Then slowly slide your foot back down until your leg is flat on the floor again. [place]: Lying on your back spread your legs to the side as far apart as you can without causing discomfort.  A rehabilitation program following serious knee injuries can speed recovery and prevent re-injury in the future due to weakened muscles. Contact your doctor or a physical therapist for more information on knee rehabilitation.   POST-OPERATIVE OPIOID TAPER INSTRUCTIONS: It is important to wean off of your opioid medication as soon as possible. If you do not need pain medication after your surgery it is ok  to stop day one. Opioids include: Codeine, Hydrocodone(Norco, Vicodin), Oxycodone(Percocet, oxycontin) and hydromorphone amongst others.  Long term and even short term use of opiods can cause: Increased pain response Dependence Constipation Depression Respiratory depression And more.  Withdrawal symptoms can include Flu like symptoms Nausea, vomiting And more Techniques to manage these symptoms Hydrate well Eat regular healthy meals Stay active Use relaxation  techniques(deep breathing, meditating, yoga) Do Not substitute Alcohol to help with tapering If you have been on opioids for less than two weeks and do not have pain than it is ok to stop all together.  Plan to wean off of opioids This plan should start within one week post op of your joint replacement. Maintain the same interval or time between taking each dose and first decrease the dose.  Cut the total daily intake of opioids by one tablet each day Next start to increase the time between doses. The last dose that should be eliminated is the evening dose.   IF YOU ARE TRANSFERRED TO A SKILLED REHAB FACILITY If the patient is transferred to a skilled rehab facility following release from the hospital, a list of the current medications will be sent to the facility for the patient to continue.  When discharged from the skilled rehab facility, please have the facility set up the patient's [place] prior to being released. Also, the skilled facility will be responsible for providing the patient with their medications at time of release from the facility to include their pain medication, the muscle relaxants, and their blood thinner medication. If the patient is still at the rehab facility at time of the two week follow up appointment, the skilled rehab facility will also need to assist the patient in arranging follow up appointment in our office and any transportation needs.  MAKE SURE YOU:  Understand these  instructions.  Get help right away if you are not doing well or get worse.   DENTAL ANTIBIOTICS:  In most cases prophylactic antibiotics for Dental procdeures after total joint surgery are not necessary.  Exceptions are as follows:  1. History of prior total joint infection  2. Severely immunocompromised (Organ Transplant, cancer chemotherapy, Rheumatoid biologic medications such as [place])  3. Poorly controlled diabetes (A1C &gt; 8.0, blood glucose over 200)  If you have one of these conditions, contact your surgeon for an antibiotic prescription, prior to your dental procedure.    Pick up stool softner and laxative for home use following surgery while on pain medications. Do not submerge incision under water. Please use good hand washing techniques while changing dressing each day. May shower starting three days after surgery. Please use a clean towel to pat the incision dry following showers. Continue to use ice for pain and swelling after surgery. Do not use any lotions or creams on the incision until instructed by your surgeon.

## 2022-07-05 NOTE — Transfer of Care (Signed)
Immediate Anesthesia Transfer of Care Note  Patient: Shelley Dixon  Procedure(s) Performed: Procedure(s): TOTAL KNEE ARTHROPLASTY (Left)  Patient Location: PACU  Anesthesia Type:Spinal  Level of Consciousness: awake, alert  and oriented  Airway & Oxygen Therapy: Patient Spontanous Breathing  Post-op Assessment: Report given to RN and Post -op Vital signs reviewed and stable  Post vital signs: Reviewed and stable  Last Vitals:  Vitals:   07/05/22 0801 07/05/22 0806  BP:    Pulse: 75 74  Resp: 16 15  Temp:    SpO2: 73% 53%    Complications: No apparent anesthesia complications

## 2022-07-05 NOTE — Evaluation (Signed)
Physical Therapy Evaluation Patient Details Name: Shelley Dixon MRN: [id number] DOB: 03/22/1961 Today's Date: 07/05/2022  History of Present Illness  Pt is a 61yo female presenting s/p L-TKA on 07/05/22. PMH: DM, GERD, HTN  Clinical Impression  Shelley Dixon is a 61 y.o. female POD 0 s/p L-TKA. Patient reports independence with mobility at baseline. Patient is now limited by functional impairments (see PT problem list below) and requires supervision for bed mobility and min guard for transfers. Patient was able to ambulate 20 feet with RW and min guard level of assist. Patient instructed in exercise to facilitate ROM and circulation to manage edema. Provided incentive spirometer and with Vcs pt able to achieve 1253m. Patient will benefit from continued skilled PT interventions to address impairments and progress towards PLOF. Acute PT will follow to progress mobility and stair training in preparation for safe discharge home.       Recommendations for follow up therapy are one component of a multi-disciplinary discharge planning process, led by the attending physician.  Recommendations may be updated based on patient status, additional functional criteria and insurance authorization.  Follow Up Recommendations Follow physician's recommendations for discharge plan and follow up therapies      Assistance Recommended at Discharge Intermittent Supervision/Assistance  Patient can return home with the following  A little help with walking and/or transfers;A little help with bathing/dressing/bathroom;Assistance with cooking/housework;Help with stairs or ramp for entrance;Assist for transportation    Equipment Recommendations None recommended by PT  Recommendations for Other Services       Functional Status Assessment Patient has had a recent decline in their functional status and demonstrates the ability to make significant improvements in function in a reasonable and predictable amount of  time.     Precautions / Restrictions Precautions Precautions: Fall;Knee Precaution Booklet Issued: No Precaution Comments: no pillow under the knee Restrictions Weight Bearing Restrictions: No      Mobility  Bed Mobility Overal bed mobility: Needs Assistance Bed Mobility: Supine to Sit     Supine to sit: Supervision     General bed mobility comments: for safety only    Transfers Overall transfer level: Needs assistance Equipment used: Rolling walker (2 wheels) Transfers: Sit to/from Stand Sit to Stand: Min guard           General transfer comment: Pt required min guard for safety, multimodal cues for hand placement and powering up from bed    Ambulation/Gait Ambulation/Gait assistance: Min guard, +2 safety/equipment Gait Distance (Feet): 20 Feet Assistive device: Rolling walker (2 wheels) Gait Pattern/deviations: Step-to pattern Gait velocity: decreased     General Gait Details: Pt ambulated with RW and min guard, +2 for recliner follow for safety. Pt with shortened step-to pattern with heavy BUE use, safe mobility.  Stairs            Wheelchair Mobility    Modified Rankin (Stroke Patients Only)       Balance Overall balance assessment: Needs assistance Sitting-balance support: Feet supported, No upper extremity supported Sitting balance-Leahy Scale: Good     Standing balance support: Reliant on assistive device for balance, During functional activity, Bilateral upper extremity supported Standing balance-Leahy Scale: Poor                               Pertinent Vitals/Pain Pain Assessment Pain Assessment: 0-10 Pain Score: 5  Pain Location: left knee Pain Descriptors / Indicators: Operative site guarding Pain Intervention(s): Limited  activity within patient's tolerance, Monitored during session, Repositioned, Ice applied    Home Living Family/patient expects to be discharged to:: Private residence Living Arrangements:  Spouse/significant other Available Help at Discharge: Family;Available 24 hours/day Type of Home: House Home Access: Stairs to enter Entrance Stairs-Rails: None Entrance Stairs-Number of Steps: 2 Alternate Level Stairs-Number of Steps: 12 Home Layout: Two level;1/2 bath on main level Home Equipment: Conservation officer, nature (2 wheels);Cane - single point;Shower seat      Prior Function Prior Level of Function : Independent/Modified Independent;Working/employed;Driving (Works in [employer])             Mobility Comments: ind ADLs Comments: ind     Journalist, newspaper        Extremity/Trunk Assessment   Upper Extremity Assessment Upper Extremity Assessment: Overall WFL for tasks assessed    Lower Extremity Assessment Lower Extremity Assessment: RLE deficits/detail;LLE deficits/detail RLE Deficits / Details: MMT ank DF/PF 5/5 RLE Sensation: WNL LLE Deficits / Details: MMT ank DF/PF 5/5, no extensor lag noted LLE Sensation: WNL    Cervical / Trunk Assessment Cervical / Trunk Assessment: Normal  Communication   Communication: No difficulties  Cognition Arousal/Alertness: Awake/alert Behavior During Therapy: WFL for tasks assessed/performed Overall Cognitive Status: Within Functional Limits for tasks assessed                                          General Comments General comments (skin integrity, edema, etc.): Husband present    Exercises Total Joint Exercises Ankle Circles/Pumps: AROM, Both, 10 reps   Assessment/Plan    PT Assessment Patient needs continued PT services  PT Problem List Decreased strength;Decreased range of motion;Decreased activity tolerance;Decreased balance;Decreased mobility;Decreased coordination;Pain       PT Treatment Interventions DME instruction;Gait training;Stair training;Functional mobility training;Therapeutic activities;Therapeutic exercise;Balance training;Neuromuscular re-education;Patient/family education    PT Goals  (Current goals can be found in the Care Plan section)  Acute Rehab PT Goals Patient Stated Goal: Cruise in late jan without pain PT Goal Formulation: With patient Time For Goal Achievement: 07/12/22 Potential to Achieve Goals: Good    Frequency 7X/week     Co-evaluation               AM-PAC PT "6 Clicks" Mobility  Outcome Measure Help needed turning from your back to your side while in a flat bed without using bedrails?: None Help needed moving from lying on your back to sitting on the side of a flat bed without using bedrails?: A Little Help needed moving to and from a bed to a chair (including a wheelchair)?: A Little Help needed standing up from a chair using your arms (e.g., wheelchair or bedside chair)?: A Little Help needed to walk in hospital room?: A Little Help needed climbing 3-5 steps with a railing? : A Little 6 Click Score: 19    End of Session Equipment Utilized During Treatment: Gait belt Activity Tolerance: Patient tolerated treatment well;No increased pain Patient left: in chair;with call bell/phone within reach;with chair alarm set;with family/visitor present;with SCD's reapplied Nurse Communication: Mobility status PT Visit Diagnosis: Pain;Difficulty in walking, not elsewhere classified (R26.2) Pain - Right/Left: Left Pain - part of body: Knee    Time: 3419-3790 PT Time Calculation (min) (ACUTE ONLY): 21 min   Charges:   PT Evaluation $PT Eval Low Complexity: 1 Low          Coolidge Breeze, PT, DPT [facility]  Rehabilitation Department Office: [phone] Weekend pager: [phone]  Coolidge Breeze 07/05/2022, 3:27 PM

## 2022-07-05 NOTE — Anesthesia Postprocedure Evaluation (Signed)
Anesthesia Post Note  Patient: Shelley Dixon  Procedure(s) Performed: TOTAL KNEE ARTHROPLASTY (Left: Knee)     Patient location during evaluation: PACU Anesthesia Type: Regional and Spinal Level of consciousness: oriented and awake and alert Pain management: pain level controlled Vital Signs Assessment: post-procedure vital signs reviewed and stable Respiratory status: spontaneous breathing, respiratory function stable and patient connected to nasal cannula oxygen Cardiovascular status: blood pressure returned to baseline and stable Postop Assessment: no headache, no backache and no apparent nausea or vomiting Anesthetic complications: no   No notable events documented.  Last Vitals:  Vitals:   07/05/22 1045 07/05/22 1100  BP: 127/76 128/70  Pulse: 74 61  Resp: 20 13  Temp:  36.8 C  SpO2: 100% 100%    Last Pain:  Vitals:   07/05/22 1212  TempSrc:   PainSc: 4                  Gatsby Chismar

## 2022-07-05 NOTE — Progress Notes (Signed)
Orthopedic Tech Progress Note Patient Details:  Shelley Dixon 03-20-1961 [id number]  CPM Left Knee CPM Left Knee: On Left Knee Flexion (Degrees): 40 Left Knee Extension (Degrees): 10  Post Interventions Patient Tolerated: Well  Shelley Dixon E [name] 07/05/2022, 10:02 AM

## 2022-07-05 NOTE — Interval H&P Note (Signed)
History and Physical Interval Note:  07/05/2022 6:23 AM  Shelley Dixon  has presented today for surgery, with the diagnosis of left knee osteoarthritis.  The various methods of treatment have been discussed with the patient and family. After consideration of risks, benefits and other options for treatment, the patient has consented to  Procedure(s): TOTAL KNEE ARTHROPLASTY (Left) as a surgical intervention.  The patient's history has been reviewed, patient examined, no change in status, stable for surgery.  I have reviewed the patient's chart and labs.  Questions were answered to the patient's satisfaction.     Pilar Plate Larosa Rhines

## 2022-07-06 ENCOUNTER — Encounter (HOSPITAL_COMMUNITY): Payer: Self-pay | Admitting: Orthopedic Surgery

## 2022-07-06 DIAGNOSIS — Z79899 Other long term (current) drug therapy: Secondary | ICD-10-CM | POA: Diagnosis not present

## 2022-07-06 DIAGNOSIS — Z85828 Personal history of other malignant neoplasm of skin: Secondary | ICD-10-CM | POA: Diagnosis not present

## 2022-07-06 DIAGNOSIS — M1712 Unilateral primary osteoarthritis, left knee: Secondary | ICD-10-CM | POA: Diagnosis not present

## 2022-07-06 DIAGNOSIS — Z87891 Personal history of nicotine dependence: Secondary | ICD-10-CM | POA: Diagnosis not present

## 2022-07-06 DIAGNOSIS — Z7985 Long-term (current) use of injectable non-insulin antidiabetic drugs: Secondary | ICD-10-CM | POA: Diagnosis not present

## 2022-07-06 DIAGNOSIS — I10 Essential (primary) hypertension: Secondary | ICD-10-CM | POA: Diagnosis not present

## 2022-07-06 DIAGNOSIS — Z7984 Long term (current) use of oral hypoglycemic drugs: Secondary | ICD-10-CM | POA: Diagnosis not present

## 2022-07-06 DIAGNOSIS — E119 Type 2 diabetes mellitus without complications: Secondary | ICD-10-CM | POA: Diagnosis not present

## 2022-07-06 DIAGNOSIS — Z794 Long term (current) use of insulin: Secondary | ICD-10-CM | POA: Diagnosis not present

## 2022-07-06 LAB — CBC
HCT: 32.4 % — ABNORMAL LOW (ref 36.0–46.0)
Hemoglobin: 10.7 g/dL — ABNORMAL LOW (ref 12.0–15.0)
MCH: 29.6 pg (ref 26.0–34.0)
MCHC: 33 g/dL (ref 30.0–36.0)
MCV: 89.8 fL (ref 80.0–100.0)
Platelets: 226 10*3/uL (ref 150–400)
RBC: 3.61 MIL/uL — ABNORMAL LOW (ref 3.87–5.11)
RDW: 13 % (ref 11.5–15.5)
WBC: 13.7 10*3/uL — ABNORMAL HIGH (ref 4.0–10.5)
nRBC: 0 % (ref 0.0–0.2)

## 2022-07-06 LAB — BASIC METABOLIC PANEL
Anion gap: 8 (ref 5–15)
BUN: 25 mg/dL — ABNORMAL HIGH (ref 6–20)
CO2: 22 mmol/L (ref 22–32)
Calcium: 9.5 mg/dL (ref 8.9–10.3)
Chloride: 105 mmol/L (ref 98–111)
Creatinine, Ser: 1.04 mg/dL — ABNORMAL HIGH (ref 0.44–1.00)
GFR, Estimated: >60 mL/min (ref >60)
Glucose, Bld: 187 mg/dL — ABNORMAL HIGH (ref 70–99)
Potassium: 4.3 mmol/L (ref 3.5–5.1)
Sodium: 135 mmol/L (ref 135–145)

## 2022-07-06 LAB — GLUCOSE, CAPILLARY
Glucose-Capillary: 161 mg/dL — ABNORMAL HIGH (ref 70–99)
Glucose-Capillary: 215 mg/dL — ABNORMAL HIGH (ref 70–99)

## 2022-07-06 MED ORDER — TRAMADOL HCL 50 MG PO TABS: 50.0000 mg | ORAL_TABLET | Freq: Four times a day (QID) | ORAL | 0 refills | Status: DC | PRN

## 2022-07-06 MED ORDER — METHOCARBAMOL 500 MG PO TABS: 500.0000 mg | ORAL_TABLET | Freq: Four times a day (QID) | ORAL | 0 refills | Status: DC | PRN

## 2022-07-06 MED ORDER — ASPIRIN 325 MG PO TBEC: 325.0000 mg | DELAYED_RELEASE_TABLET | Freq: Two times a day (BID) | ORAL | 0 refills | Status: AC

## 2022-07-06 MED ORDER — GABAPENTIN 300 MG PO CAPS: ORAL_CAPSULE | ORAL | 0 refills | Status: DC

## 2022-07-06 MED ORDER — OXYCODONE HCL 5 MG PO TABS: 5.0000 mg | ORAL_TABLET | Freq: Four times a day (QID) | ORAL | 0 refills | Status: DC | PRN

## 2022-07-06 NOTE — Progress Notes (Signed)
Physical Therapy Treatment Patient Details Name: Shelley Dixon MRN: [id number] DOB: 1960/11/18 Today's Date: 07/06/2022   History of Present Illness Pt is a 61 yo female presenting s/p L-TKA on 07/05/22. PMH: DM, GERD, HTN    PT Comments    Pt ambulated in hallway and performed LE exercises.  Plan to practice stairs this afternoon, and pt anticipates d/c home later today.   Recommendations for follow up therapy are one component of a multi-disciplinary discharge planning process, led by the attending physician.  Recommendations may be updated based on patient status, additional functional criteria and insurance authorization.  Follow Up Recommendations  Follow physician's recommendations for discharge plan and follow up therapies     Assistance Recommended at Discharge Intermittent Supervision/Assistance  Patient can return home with the following A little help with walking and/or transfers;A little help with bathing/dressing/bathroom;Assistance with cooking/housework;Help with stairs or ramp for entrance;Assist for transportation   Equipment Recommendations  None recommended by PT    Recommendations for Other Services       Precautions / Restrictions Precautions Precautions: Fall;Knee Restrictions Weight Bearing Restrictions: No     Mobility  Bed Mobility Overal bed mobility: Needs Assistance Bed Mobility: Sit to Supine       Sit to supine: Min guard   General bed mobility comments: verbal cues for self assist    Transfers Overall transfer level: Needs assistance Equipment used: Rolling walker (2 wheels) Transfers: Sit to/from Stand Sit to Stand: Min guard           General transfer comment: cues for UE and LE positioning    Ambulation/Gait Ambulation/Gait assistance: Min guard Gait Distance (Feet): 100 Feet Assistive device: Rolling walker (2 wheels) Gait Pattern/deviations: Step-to pattern, Decreased stance time - left, Antalgic Gait velocity:  decreased     General Gait Details: verbal cues for sequence, RW positioning, allowing knee flexion   Stairs             Wheelchair Mobility    Modified Rankin (Stroke Patients Only)       Balance                                            Cognition Arousal/Alertness: Awake/alert Behavior During Therapy: WFL for tasks assessed/performed Overall Cognitive Status: Within Functional Limits for tasks assessed                                          Exercises Total Joint Exercises Ankle Circles/Pumps: AROM, Both, 10 reps Quad Sets: AROM, Both, 10 reps Heel Slides: AAROM, Left, 10 reps Hip ABduction/ADduction: AAROM, Left, 10 reps Straight Leg Raises: AAROM, Left, 10 reps Goniometric ROM: limited left knee flexion approx 20* however pt reports limited flexion prior to surgery    General Comments        Pertinent Vitals/Pain Pain Assessment Pain Assessment: 0-10 Pain Score: 4  Pain Location: left knee Pain Descriptors / Indicators: Aching, Sore Pain Intervention(s): Premedicated before session, Monitored during session, Repositioned, Ice applied    Home Living                          Prior Function            PT Goals (current goals can  now be found in the care plan section) Progress towards PT goals: Progressing toward goals    Frequency    7X/week      PT Plan Current plan remains appropriate    Co-evaluation              AM-PAC PT "6 Clicks" Mobility   Outcome Measure  Help needed turning from your back to your side while in a flat bed without using bedrails?: None Help needed moving from lying on your back to sitting on the side of a flat bed without using bedrails?: A Little Help needed moving to and from a bed to a chair (including a wheelchair)?: A Little Help needed standing up from a chair using your arms (e.g., wheelchair or bedside chair)?: A Little Help needed to walk in  hospital room?: A Little Help needed climbing 3-5 steps with a railing? : A Little 6 Click Score: 19    End of Session Equipment Utilized During Treatment: Gait belt Activity Tolerance: Patient tolerated treatment well Patient left: with call bell/phone within reach;with family/visitor present;in bed Nurse Communication: Mobility status PT Visit Diagnosis: Difficulty in walking, not elsewhere classified (R26.2)     Time: 1962-2297 PT Time Calculation (min) (ACUTE ONLY): 19 min  Charges:  $Therapeutic Exercise: 8-22 mins                    Jannette Spanner PT, DPT Physical Therapist Acute Rehabilitation Services Preferred contact method: Secure Chat Weekend Pager Only: [phone] Office: [place] 07/06/2022, 3:41 PM

## 2022-07-06 NOTE — Progress Notes (Signed)
Physical Therapy Treatment Patient Details Name: Shelley Dixon MRN: [id number] DOB: 28-Jul-1961 Today's Date: 07/06/2022   History of Present Illness Pt is a 61 yo female presenting s/p L-TKA on 07/05/22. PMH: DM, GERD, HTN    PT Comments    Pt ambulated in hallway and practiced safe stair technique with spouse.  Pt provided with HEP handout.  Pt had no further questions and feels ready for d/c home today.    Recommendations for follow up therapy are one component of a multi-disciplinary discharge planning process, led by the attending physician.  Recommendations may be updated based on patient status, additional functional criteria and insurance authorization.  Follow Up Recommendations  Follow physician's recommendations for discharge plan and follow up therapies     Assistance Recommended at Discharge Intermittent Supervision/Assistance  Patient can return home with the following A little help with walking and/or transfers;A little help with bathing/dressing/bathroom;Assistance with cooking/housework;Help with stairs or ramp for entrance;Assist for transportation   Equipment Recommendations  None recommended by PT    Recommendations for Other Services       Precautions / Restrictions Precautions Precautions: Fall;Knee Restrictions Weight Bearing Restrictions: No     Mobility  Bed Mobility Overal bed mobility: Needs Assistance Bed Mobility: Sit to Supine       Sit to supine: Supervision   General bed mobility comments: verbal cues for self assist    Transfers Overall transfer level: Needs assistance Equipment used: Rolling walker (2 wheels) Transfers: Sit to/from Stand Sit to Stand: Min guard           General transfer comment: spouse assisted    Ambulation/Gait Ambulation/Gait assistance: Min guard Gait Distance (Feet): 80 Feet Assistive device: Rolling walker (2 wheels) Gait Pattern/deviations: Step-to pattern, Decreased stance time - left,  Antalgic Gait velocity: decreased     General Gait Details: verbal cues for sequence, RW positioning, allowing knee flexion; spouse held gait belt   Stairs Stairs: Yes Stairs assistance: Min guard Stair Management: Sideways, One rail Right, Step to pattern Number of Stairs: 2 General stair comments: verbal cues for RW placement and safety, sequence; spouse held onto gait belt   Wheelchair Mobility    Modified Rankin (Stroke Patients Only)       Balance                                            Cognition Arousal/Alertness: Awake/alert Behavior During Therapy: WFL for tasks assessed/performed Overall Cognitive Status: Within Functional Limits for tasks assessed                                          Exercises     General Comments        Pertinent Vitals/Pain Pain Assessment Pain Assessment: 0-10 Pain Score: 4  Pain Location: left knee Pain Descriptors / Indicators: Aching, Sore Pain Intervention(s): Repositioned, Monitored during session, Premedicated before session    Home Living                          Prior Function            PT Goals (current goals can now be found in the care plan section) Progress towards PT goals: Progressing toward goals  Frequency    7X/week      PT Plan Current plan remains appropriate    Co-evaluation              AM-PAC PT "6 Clicks" Mobility   Outcome Measure  Help needed turning from your back to your side while in a flat bed without using bedrails?: None Help needed moving from lying on your back to sitting on the side of a flat bed without using bedrails?: A Little Help needed moving to and from a bed to a chair (including a wheelchair)?: A Little Help needed standing up from a chair using your arms (e.g., wheelchair or bedside chair)?: A Little Help needed to walk in hospital room?: A Little Help needed climbing 3-5 steps with a railing? : A Little 6  Click Score: 19    End of Session Equipment Utilized During Treatment: Gait belt Activity Tolerance: Patient tolerated treatment well Patient left: in chair;with call bell/phone within reach;with family/visitor present Nurse Communication: Mobility status PT Visit Diagnosis: Difficulty in walking, not elsewhere classified (R26.2)     Time: 1610-9604 PT Time Calculation (min) (ACUTE ONLY): 19 min  Charges:  $Gait Training: 8-22 mins                    Jannette Spanner PT, DPT Physical Therapist Acute Rehabilitation Services Preferred contact method: Secure Chat Weekend Pager Only: [phone] Office: [place] 07/06/2022, 3:45 PM

## 2022-07-06 NOTE — Progress Notes (Signed)
Discharge package printed and instructions given to patient. Verbalizes understanding.  

## 2022-07-06 NOTE — Progress Notes (Signed)
   Subjective: 1 Day Post-Op Procedure(s) (LRB): TOTAL KNEE ARTHROPLASTY (Left) Patient reports pain as mild.   Patient seen in rounds by Dr. Wynelle Link. Patient had increased pain in the left knee yesterday, but this is improved this morning.  Denies chest pain, SOB, or calf pain. Foley catheter to be removed this AM.  We will continue therapy today, ambulated 20' yesterday.   Objective: Vital signs in last 24 hours: Temp:  [97.2 F (36.2 C)-98.3 F (36.8 C)] 98.2 F (36.8 C) (10/03 0541) Pulse Rate:  [61-95] 78 (10/03 0541) Resp:  [11-20] 17 (10/03 0541) BP: (95-159)/(62-96) 116/64 (10/03 0541) SpO2:  [91 %-100 %] 97 % (10/03 0541)  Intake/Output from previous day:  Intake/Output Summary (Last 24 hours) at 07/06/2022 0727 Last data filed at 07/06/2022 0600 Gross per 24 hour  Intake 3746.67 ml  Output 3065 ml  Net 681.67 ml     Intake/Output this shift: No intake/output data recorded.  Labs: Recent Labs    07/06/22 0337  HGB 10.7*   Recent Labs    07/06/22 0337  WBC 13.7*  RBC 3.61*  HCT 32.4*  PLT 226   Recent Labs    07/06/22 0337  NA 135  K 4.3  CL 105  CO2 22  BUN 25*  CREATININE 1.04*  GLUCOSE 187*  CALCIUM 9.5   No results for input(s): "LABPT", "INR" in the last 72 hours.  Exam: General - Patient is Alert and Oriented Extremity - Neurologically intact Neurovascular intact Sensation intact distally Dorsiflexion/Plantar flexion intact Dressing - dressing C/D/I Motor Function - intact, moving foot and toes well on exam.   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   Weight as of this encounter: 90.2 kg. Advance diet Up with therapy D/C IV fluids   Patient's anticipated LOS is less than 2 midnights, meeting these requirements: - Younger than 87 - Lives within 1 hour of care - Has a competent adult at home to recover with post-op recover - NO history of  - Chronic pain requiring opioids  - Coronary Artery Disease  - Heart failure  - Heart attack  - Stroke  - DVT/VTE  - Respiratory Failure/COPD  - Renal failure  - Anemia  - Advanced Liver disease  DVT Prophylaxis - Aspirin Weight bearing as tolerated. Continue therapy.  Plan is to go Home after hospital stay. Plan for discharge later today if progresses with therapy and meeting goals. Scheduled for OPPT at [facility]). Follow-up in the office in 2 weeks.  The PDMP database was reviewed today prior to any opioid medications being prescribed to this patient.  Theresa Duty, PA-C Orthopedic Surgery [phone] 07/06/2022, 7:27 AM

## 2022-07-06 NOTE — TOC Transition Note (Signed)
Transition of Care [facility]) - CM/SW Discharge Note   Patient Details  Name: Shelley Dixon MRN: [id number] Date of Birth: 04-22-1961  [facility] CM/SW Contact:  Lennart Pall, LCSW Phone Number: 07/06/2022, 10:38 AM   Clinical Narrative:     Met with pt and confirming she has all needed DME at home.  OPPT set up with [facility] in [place].  No TOC needs.  Final next level of care: OP Rehab Barriers to Discharge: No Barriers Identified   Patient Goals and CMS Choice Patient states their goals for this hospitalization and ongoing recovery are:: return home      Discharge Placement                       Discharge Plan and Services                DME Arranged: N/A DME Agency: NA                  Social Determinants of Health (SDOH) Interventions Food Insecurity Interventions: Intervention Not Indicated Housing Interventions: Intervention Not Indicated Transportation Interventions: Intervention Not Indicated Utilities Interventions: Intervention Not Indicated   Readmission Risk Interventions     No data to display

## 2022-07-06 NOTE — Plan of Care (Signed)
  Problem: Activity: Goal: Ability to avoid complications of mobility impairment will improve Outcome: Progressing Goal: Range of joint motion will improve Outcome: Progressing   Problem: Pain Management: Goal: Pain level will decrease with appropriate interventions Outcome: Progressing   

## 2022-07-07 NOTE — Discharge Summary (Signed)
Patient ID: Shelley Dixon MRN: [id number] DOB/AGE: 1961/06/09 61 y.o.  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 female who was admitted 07/05/2022 for operative treatment ofOA (osteoarthritis) of knee. Patient has severe unremitting pain that affects sleep, daily activities, and work/hobbies. After pre-op clearance the patient was taken to the operating room on 07/05/2022 and underwent  Procedure(s): TOTAL KNEE ARTHROPLASTY.    Patient was given perioperative antibiotics:  Anti-infectives (From admission, onward)    Start     Dose/Rate Route Frequency Ordered Stop   07/05/22 1400  ceFAZolin (ANCEF) IVPB 2g/100 mL premix        2 g 200 mL/hr over 30 Minutes Intravenous Every 6 hours 07/05/22 1120 07/05/22 2112   07/05/22 0630  ceFAZolin (ANCEF) IVPB 2g/100 mL premix        2 g 200 mL/hr over 30 Minutes Intravenous On call to O.R. 07/05/22 5573 07/05/22 0843        Patient was given sequential compression devices, early ambulation, and chemoprophylaxis to prevent DVT.  Patient benefited maximally from hospital stay and there were no complications.    Recent vital signs: Patient Vitals for the past 24 hrs:  BP Temp Temp src Pulse Resp SpO2  07/06/22 1419 138/82 98.2 F (36.8 C) Oral 79 18 96 %     Recent laboratory studies:  Recent Labs     07/06/22 0337  WBC 13.7*  HGB 10.7*  HCT 32.4*  PLT 226  NA 135  K 4.3  CL 105  CO2 22  BUN 25*  CREATININE 1.04*  GLUCOSE 187*  CALCIUM 9.5     Discharge Medications:   Allergies as of 07/06/2022       Reactions   Atorvastatin    Elevated LFT   Bydureon [exenatide]    Injection site pain   Canagliflozin Other (See Comments)   Severe UTI   Pioglitazone    swelling   Saxagliptin Other (See Comments)   Not effective   Simvastatin Other (See Comments)   ineffective        Medication List     STOP taking these medications    empagliflozin 25 MG Tabs tablet Commonly known as: Jardiance   ibuprofen 200 MG tablet Commonly known as: ADVIL   naproxen sodium 220 MG tablet Commonly known as: ALEVE       TAKE these medications    Accu-Chek Aviva Plus test strip Generic drug: glucose blood TEST 2 TIMES DAILY   Accu-Chek Aviva Plus w/Device Kit by Does not apply route.   acetaminophen 500 MG tablet Commonly known as: TYLENOL Take 1,000 mg by mouth every 6 (six) hours as needed for moderate pain or mild pain.   ALPRAZolam 0.5 MG tablet Commonly known as: XANAX Take 0.25 mg by mouth at bedtime.   aspirin EC 325  MG tablet Take 1 tablet (325 mg total) by mouth 2 (two) times daily for 20 days. Then take one 81 mg aspirin once a day for three weeks. Then discontinue aspirin.   atenolol 50 MG tablet Commonly known as: TENORMIN Take 100 mg by mouth at bedtime.   BD Pen Needle Nano 2nd Gen 32G X 4 MM Misc Generic drug: Insulin Pen Needle Inject 1 Device into the skin daily in the afternoon. Use as direct to inject insulin daily   CRANBERRY PO Take 2 tablets by mouth daily.   esomeprazole 40 MG capsule Commonly known as: NEXIUM Take 40 mg by mouth at bedtime.   fexofenadine 180 MG tablet Commonly known as: ALLEGRA Take 180 mg by mouth daily.   gabapentin 300 MG capsule Commonly known as: NEURONTIN Take a 300 mg capsule three times a day for two  weeks following surgery.Then take a 300 mg capsule two times a day for two weeks. Then resume one 300 mg capsule once a day at bedtime. What changed:  how much to take how to take this when to take this additional instructions   lisinopril-hydrochlorothiazide 20-12.5 MG tablet Commonly known as: ZESTORETIC Take 2 tablets by mouth every morning.   MELATONIN PO Take 6 mg by mouth daily.   metFORMIN 500 MG 24 hr tablet Commonly known as: GLUCOPHAGE-XR Take 3 tablets (1,500 mg total) by mouth daily. What changed:  how much to take when to take this additional instructions   methocarbamol 500 MG tablet Commonly known as: ROBAXIN Take 1 tablet (500 mg total) by mouth every 6 (six) hours as needed for muscle spasms.   ondansetron 4 MG tablet Commonly known as: ZOFRAN Take 4 mg by mouth daily as needed for nausea/vomiting.   OVER THE COUNTER MEDICATION Take 1 Package by mouth daily. Zipfizz with bottle of water with  vitamin b12   oxyCODONE 5 MG immediate release tablet Commonly known as: Oxy IR/ROXICODONE Take 1-2 tablets (5-10 mg total) by mouth every 6 (six) hours as needed for severe pain.   Ozempic (1 MG/DOSE) 4 MG/3ML Sopn Generic drug: Semaglutide (1 MG/DOSE) Inject 1 mg into the skin once a week.   rosuvastatin 5 MG tablet Commonly known as: Crestor Take 1 tablet (5 mg total) by mouth daily. What changed: when to take this   scopolamine 1 MG/3DAYS Commonly known as: TRANSDERM-SCOP Place 1 patch onto the skin as needed ([place] only).   SUPER B COMPLEX PO Take 1 tablet by mouth daily.   Toujeo SoloStar 300 UNIT/ML Solostar Pen Generic drug: insulin glargine (1 Unit Dial) Inject 36 Units into the skin daily in the afternoon.   traMADol 50 MG tablet Commonly known as: ULTRAM Take 1-2 tablets (50-100 mg total) by mouth every 6 (six) hours as needed for moderate pain.               Discharge Care Instructions  (From admission, onward)            Start     Ordered   07/06/22 0000  Weight bearing as tolerated        07/06/22 0731   07/06/22 0000  Change dressing       Comments: You may remove the bulky bandage (ACE wrap and gauze) two days after surgery. You will have an adhesive waterproof bandage underneath. Leave this in place until your first follow-up appointment.   07/06/22 0731            Diagnostic Studies: No  results found.  Disposition: Discharge disposition: 01-Home or Self Care       Discharge Instructions     Call MD / Call 911   Complete by: As directed    If you experience chest pain or shortness of breath, CALL 911 and be transported to the hospital emergency room.  If you develope a fever above 101 F, pus (white drainage) or increased drainage or redness at the wound, or calf pain, call your surgeon's office.   Change dressing   Complete by: As directed    You may remove the bulky bandage (ACE wrap and gauze) two days after surgery. You will have an adhesive waterproof bandage underneath. Leave this in place until your first follow-up appointment.   Constipation Prevention   Complete by: As directed    Drink plenty of fluids.  Prune juice may be helpful.  You may use a stool softener, such as Colace (over the counter) 100 mg twice a day.  Use MiraLax (over the counter) for constipation as needed.   Diet - low sodium heart healthy   Complete by: As directed    Do not put a pillow under the knee. Place it under the heel.   Complete by: As directed    Driving restrictions   Complete by: As directed    No driving for two weeks   Post-operative opioid taper instructions:   Complete by: As directed    POST-OPERATIVE OPIOID TAPER INSTRUCTIONS: It is important to wean off of your opioid medication as soon as possible. If you do not need pain medication after your surgery it is ok to stop day one. Opioids include: Codeine, Hydrocodone(Norco, Vicodin), Oxycodone(Percocet, oxycontin) and hydromorphone amongst  others.  Long term and even short term use of opiods can cause: Increased pain response Dependence Constipation Depression Respiratory depression And more.  Withdrawal symptoms can include Flu like symptoms Nausea, vomiting And more Techniques to manage these symptoms Hydrate well Eat regular healthy meals Stay active Use relaxation techniques(deep breathing, meditating, yoga) Do Not substitute Alcohol to help with tapering If you have been on opioids for less than two weeks and do not have pain than it is ok to stop all together.  Plan to wean off of opioids This plan should start within one week post op of your joint replacement. Maintain the same interval or time between taking each dose and first decrease the dose.  Cut the total daily intake of opioids by one tablet each day Next start to increase the time between doses. The last dose that should be eliminated is the evening dose.      TED hose   Complete by: As directed    Use stockings (TED hose) for three weeks on both leg(s).  You may remove them at night for sleeping.   Weight bearing as tolerated   Complete by: As directed         Follow-up Information     Aluisio, Pilar Plate, MD. Schedule an appointment as soon as possible for a visit in 2 week(s).   Specialty: Orthopedic Surgery Contact information: [address] [place] [address] [phone]                  Signed: Theresa Duty 07/07/2022, 11:08 AM

## 2022-07-08 ENCOUNTER — Ambulatory Visit: Payer: 59 | Admitting: Internal Medicine

## 2022-07-13 DIAGNOSIS — M25662 Stiffness of left knee, not elsewhere classified: Secondary | ICD-10-CM | POA: Diagnosis not present

## 2022-07-13 DIAGNOSIS — M25562 Pain in left knee: Secondary | ICD-10-CM | POA: Diagnosis not present

## 2023-01-10 ENCOUNTER — Other Ambulatory Visit: Payer: Self-pay | Admitting: Internal Medicine

## 2023-01-26 ENCOUNTER — Other Ambulatory Visit: Payer: Self-pay | Admitting: Internal Medicine

## 2023-03-18 ENCOUNTER — Ambulatory Visit (INDEPENDENT_AMBULATORY_CARE_PROVIDER_SITE_OTHER): Payer: 59 | Admitting: Internal Medicine

## 2023-03-18 ENCOUNTER — Encounter: Payer: Self-pay | Admitting: Internal Medicine

## 2023-03-18 VITALS — BP 124/72 | HR 86 | Ht 63.0 in | Wt 210.0 lb

## 2023-03-18 DIAGNOSIS — E1165 Type 2 diabetes mellitus with hyperglycemia: Secondary | ICD-10-CM

## 2023-03-18 DIAGNOSIS — E782 Mixed hyperlipidemia: Secondary | ICD-10-CM

## 2023-03-18 DIAGNOSIS — Z7984 Long term (current) use of oral hypoglycemic drugs: Secondary | ICD-10-CM

## 2023-03-18 DIAGNOSIS — Z794 Long term (current) use of insulin: Secondary | ICD-10-CM

## 2023-03-18 DIAGNOSIS — Z7985 Long-term (current) use of injectable non-insulin antidiabetic drugs: Secondary | ICD-10-CM

## 2023-03-18 LAB — POCT GLUCOSE (DEVICE FOR HOME USE): POC Glucose: 383 mg/dl — AB (ref 70–99)

## 2023-03-18 LAB — POCT GLYCOSYLATED HEMOGLOBIN (HGB A1C): Hemoglobin A1C: 9.9 % — AB (ref 4.0–5.6)

## 2023-03-18 MED ORDER — ACCU-CHEK GUIDE VI STRP: 1.0000 | ORAL_STRIP | Freq: Every day | 3 refills | Status: DC

## 2023-03-18 MED ORDER — ROSUVASTATIN CALCIUM 5 MG PO TABS: 5.0000 mg | ORAL_TABLET | Freq: Every day | ORAL | 3 refills | Status: DC

## 2023-03-18 MED ORDER — BD PEN NEEDLE NANO 2ND GEN 32G X 4 MM MISC: 1.0000 | Freq: Every day | 2 refills | Status: DC

## 2023-03-18 MED ORDER — TOUJEO SOLOSTAR 300 UNIT/ML ~~LOC~~ SOPN: 40.0000 [IU] | PEN_INJECTOR | Freq: Every day | SUBCUTANEOUS | 2 refills | Status: DC

## 2023-03-18 MED ORDER — ACCU-CHEK GUIDE W/DEVICE KIT: 1.0000 | PACK | Freq: Every day | 0 refills | Status: DC

## 2023-03-18 MED ORDER — METFORMIN HCL ER 500 MG PO TB24: ORAL_TABLET | ORAL | 2 refills | Status: DC

## 2023-03-18 MED ORDER — SEMAGLUTIDE (2 MG/DOSE) 8 MG/3ML ~~LOC~~ SOPN: 2.0000 mg | PEN_INJECTOR | SUBCUTANEOUS | 3 refills | Status: DC

## 2023-03-18 NOTE — Progress Notes (Signed)
Name: Shelley Dixon  Age/ Sex: 62 y.o., female   MRN/ DOB: [id number], May 18, 1961     PCP: Ollen Bowl, MD   Reason for Endocrinology Evaluation: Type 2 Diabetes Mellitus  Initial Endocrine Consultative Visit: 11/08/2019      PATIENT IDENTIFIER: Shelley Dixon is a 62 y.o. female with a past medical history of T2Dm, HTN, CKD and Dyslipidemia . The patient has followed with Endocrinology clinic since 11/08/2019 for consultative assistance with management of her diabetes.  DIABETIC HISTORY:  Shelley Dixon was diagnosed with DM many years ago. She reports intolerance to invokana - yeast infections. She recalls developing swelling  to Actos may have been swelling . Her hemoglobin A1c has ranged from 5.8% years ago , peaking at 10.9%  in 2021.  On her initial visit to our clinic she had an A1c of  10.9%  , she was on repaglinide, metformin, glimepiride and truclicity . We switched the repaglinide, and glimepiride to basal insulin, continued trulicity and metformin   Shelley Dixon developed intolerance to Trulicity - vomiting and nausea. She already has reported intolerance of SGLT-2 inhibitors   Jardiance started 06/2020   Decreased Metformin due to diarrhea 02/2021  Start Ozempic 05/2021  Stopped Jardiance 03/2022 due to recurrent Uti's   SUBJECTIVE:   During the last visit (01/04/2022): A1c 6.9 %      Today (03/18/2023): Shelley Dixon is here for a follow up on diabetes management.  She  has NOT been to our clinic in 14 months. She has checks her blood sugars 1-2 times daily .  The patient has not had hypoglycemic episodes since the last clinic visit.   She is s/p left knee surgery 07/2022 Has not taken Jardiance due to recurrent UTI's  Rare nausea  Denies constipation or diarrhea   HOME ENDOCRINE  REGIMEN:  Toujeo  36 units daily  Metformin 500 mg XR 1 tablet QAM and 2 tabs QPM  Ozempic 1 mg weekly  Rosuvastatin 5 mg daily     Statin: yes ACE-I/ARB: yes   METER  DOWNLOAD SUMMARY: unable to download 116- 140 mg/dL       DIABETIC COMPLICATIONS: Microvascular complications:    Denies: retinopathy, neuropathy , CKD  Last eye exam: Completed 2022, scheduled next week    Macrovascular complications:    Denies: CAD, PVD, CVA    HISTORY:  Past Medical History:  Past Medical History:  Diagnosis Date   Arthritis    Cancer (HCC)    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 She has never used smokeless tobacco. She reports that she does not currently use alcohol. She reports that she does not use drugs. Family History: No family history on file.   HOME MEDICATIONS: Allergies as of 03/18/2023       Reactions   Atorvastatin  Elevated LFT   Bydureon [exenatide]    Injection site pain   Canagliflozin Other (See Comments)   Severe UTI   Pioglitazone    swelling   Saxagliptin Other (See Comments)   Not effective   Simvastatin Other (See Comments)   ineffective        Medication List        Accurate as of March 18, 2023  2:34 PM. If you have any questions, ask your nurse or doctor.          STOP taking these medications    methocarbamol 500 MG tablet Commonly known as: ROBAXIN Stopped by: Scarlette Shorts, MD   oxyCODONE 5 MG immediate release tablet Commonly known as: Oxy IR/ROXICODONE Stopped by: Scarlette Shorts, MD   traMADol 50  MG tablet Commonly known as: ULTRAM Stopped by: Scarlette Shorts, MD       TAKE these medications    Accu-Chek Aviva Plus test strip Generic drug: glucose blood TEST 2 TIMES DAILY   Accu-Chek Aviva Plus w/Device Kit by Does not apply route.   acetaminophen 500 MG tablet Commonly known as: TYLENOL Take 1,000 mg by mouth every 6 (six) hours as needed for moderate pain or mild pain.   ALPRAZolam 0.5 MG tablet Commonly known as: XANAX Take 0.25 mg by mouth at bedtime.   atenolol 50 MG tablet Commonly known as: TENORMIN Take 100 mg by mouth at bedtime.   BD Pen Needle Nano 2nd Gen 32G X 4 MM Misc Generic drug: Insulin Pen Needle Inject 1 Device into the skin daily in the afternoon. Use as direct to inject insulin daily   CRANBERRY PO Take 2 tablets by mouth daily.   esomeprazole 40 MG capsule Commonly known as: NEXIUM Take 40 mg by mouth at bedtime.   fexofenadine 180 MG tablet Commonly known as: ALLEGRA Take 180 mg by mouth daily.   gabapentin 300 MG capsule Commonly known as: NEURONTIN Take a 300 mg capsule three times a day for two weeks following surgery.Then take a 300 mg capsule two times a day for two weeks. Then resume one 300 mg capsule once a day at bedtime.   lisinopril-hydrochlorothiazide 20-12.5 MG tablet Commonly known as: ZESTORETIC Take 2 tablets by mouth every morning.   MELATONIN PO Take 6 mg by mouth daily.   metFORMIN 500 MG 24 hr tablet Commonly known as: GLUCOPHAGE-XR Take 2 tablets (1,000 mg total) by mouth daily.   ondansetron 4 MG tablet Commonly known as: ZOFRAN Take 4 mg by mouth daily as needed for nausea/vomiting.   OVER THE COUNTER MEDICATION Take 1 Package by mouth daily. Zipfizz with bottle of water with  vitamin b12   Ozempic (1 MG/DOSE) 4 MG/3ML Sopn Generic drug: Semaglutide (1 MG/DOSE) Inject 1 mg into the skin once a week.   rosuvastatin 5 MG tablet Commonly known as: Crestor Take 1 tablet (5 mg total) by mouth  daily. What changed: when to take this   scopolamine 1 MG/3DAYS Commonly known as: TRANSDERM-SCOP Place 1 patch onto the skin as needed ([place] only).   SUPER B COMPLEX PO Take 1 tablet by mouth daily.   Toujeo SoloStar 300 UNIT/ML Solostar Pen Generic drug: insulin glargine (1 Unit Dial) INJECT 36 UNITS INTO THE SKIN DAILY IN THE AFTERNOON.          OBJECTIVE:   Vital Signs: BP 124/72 (BP Location: Left Arm, Patient Position: Sitting, Cuff Size: Large)   Pulse 86   Ht 5\' 3"  (  1.6 m)   Wt 210 lb (95.3 kg)   SpO2 99%   BMI 37.20 kg/m   Wt Readings from Last 3 Encounters:  03/18/23 210 lb (95.3 kg)  07/05/22 198 lb 12.8 oz (90.2 kg)  06/29/22 198 lb 12.8 oz (90.2 kg)     Exam: General: Shelley Dixon appears well and is in NAD  Lungs: Clear with good BS bilat   Heart: RRR   Abdomen: soft, nontende  Extremities: Trace pretibial edema.  Neuro: MS is good with appropriate affect, Shelley Dixon is alert and Ox3    DM foot exam:03/18/2023   The skin of the feet is intact without sores or ulcerations. The pedal pulses are 2+ on right and 2+ on left. The sensation is intact to a screening 5.07, 10 gram monofilament bilaterally    DATA REVIEWED:   In office 383 mg/DL  ASSESSMENT / PLAN / RECOMMENDATIONS:   1) Type 2 Diabetes Mellitus, Poorly  controlled, Without complications - Most recent A1c of 9.9 %. Goal A1c < 7.0 %.   - A1c has improved from 6.0% to 9.9% -Patient has not been to our office in 14 months, she has not checked her glucose in months, discussed the importance of glucose checkups -Emphasized importance of low-carb diet as well as continuing to exercise [employer] did not cover dexcom  - She developed severe nausea and vomiting to Trulicity, tolerating Ozempic will increase - She is intolerant to pioglitazone -Intolerant to Jardiance due to recurrent genital infections -Will increase insulin as below   MEDICATIONS:  -Continue  Metfomrin 1 tablet in the morning  and 2 tablets with Supper -Increase Toujeo 40 units daily  -Increase Ozempic 2 mg weekly     EDUCATION / INSTRUCTIONS: BG monitoring instructions: Patient is instructed to check her blood sugars 1 times a day,  fasting Call [facility] if: BG persistently < 70  I reviewed the Rule of 15 for the treatment of hypoglycemia in detail with the patient. Literature supplied.    2) Diabetic complications:  Eye: Does not have known diabetic retinopathy.  Neuro/ Feet: Does not have known diabetic peripheral neuropathy .  Renal: Patient does not have known baseline CKD. She   is on an ACEI/ARB at present.      3) Dyslipidemia :   -Historically she has had LDL above goal -As well as elevated LFTs to atorvastatin -She has history of intolerance to simvastatin -I had switched pravastatin to rosuvastatin 06/2020 -Following that her LDL optimized, we have opted not to check lipid panel today due to out-of-control diabetes and dietary indiscretions  Medications  Continue Rosuvastatin 5 mg daily       F/U in 3 months    Signed electronically by: Lyndle Herrlich, MD  [facility] Endocrinology  [facility] [address] [place], [place] [address] Phone: [phone] FAX: [phone]   CC: Ollen Bowl, MD [address] [place] [address] Phone: [phone]  Fax: [phone]  Return to Endocrinology clinic as below: No future appointments.

## 2023-03-18 NOTE — Patient Instructions (Signed)
-   Continue Metformin 500 mg, 1 tablet in the morning and 2 in the evening -Increase Toujeo 40 units daily  -Increase Ozempic 2  mg once weekly       HOW TO TREAT LOW BLOOD SUGARS (Blood sugar LESS THAN 70 MG/DL) Please follow the RULE OF 15 for the treatment of hypoglycemia treatment (when your (blood sugars are less than 70 mg/dL)   STEP 1: Take 15 grams of carbohydrates when your blood sugar is low, which includes:  3-4 GLUCOSE TABS  OR 3-4 OZ OF JUICE OR REGULAR SODA OR ONE TUBE OF GLUCOSE GEL    STEP 2: RECHECK blood sugar in 15 MINUTES STEP 3: If your blood sugar is still low at the 15 minute recheck --> then, go back to STEP 1 and treat AGAIN with another 15 grams of carbohydrates.

## 2023-05-03 ENCOUNTER — Other Ambulatory Visit: Payer: Self-pay | Admitting: Family Medicine

## 2023-05-03 DIAGNOSIS — R7401 Elevation of levels of liver transaminase levels: Secondary | ICD-10-CM

## 2023-05-10 ENCOUNTER — Ambulatory Visit
Admission: RE | Admit: 2023-05-10 | Discharge: 2023-05-10 | Disposition: A | Discharge status: Home / Self Care | Payer: 59 | Source: Ambulatory Visit | Attending: Family Medicine | Admitting: Family Medicine

## 2023-05-10 DIAGNOSIS — R7401 Elevation of levels of liver transaminase levels: Secondary | ICD-10-CM

## 2023-06-09 ENCOUNTER — Ambulatory Visit: Payer: 59 | Admitting: Internal Medicine

## 2023-07-16 ENCOUNTER — Other Ambulatory Visit: Payer: Self-pay | Admitting: Internal Medicine

## 2023-09-23 ENCOUNTER — Ambulatory Visit (INDEPENDENT_AMBULATORY_CARE_PROVIDER_SITE_OTHER): Payer: 59 | Admitting: Internal Medicine

## 2023-09-23 ENCOUNTER — Encounter: Payer: Self-pay | Admitting: Internal Medicine

## 2023-09-23 VITALS — BP 122/78 | HR 85 | Ht 63.0 in | Wt 211.0 lb

## 2023-09-23 DIAGNOSIS — Z7985 Long-term (current) use of injectable non-insulin antidiabetic drugs: Secondary | ICD-10-CM

## 2023-09-23 DIAGNOSIS — E782 Mixed hyperlipidemia: Secondary | ICD-10-CM | POA: Diagnosis not present

## 2023-09-23 DIAGNOSIS — E1165 Type 2 diabetes mellitus with hyperglycemia: Secondary | ICD-10-CM

## 2023-09-23 DIAGNOSIS — Z7984 Long term (current) use of oral hypoglycemic drugs: Secondary | ICD-10-CM | POA: Diagnosis not present

## 2023-09-23 LAB — POCT GLYCOSYLATED HEMOGLOBIN (HGB A1C): Hemoglobin A1C: 9 % — AB (ref 4.0–5.6)

## 2023-09-23 MED ORDER — TIRZEPATIDE 5 MG/0.5ML ~~LOC~~ SOAJ: 5.0000 mg | SUBCUTANEOUS | 3 refills | Status: DC

## 2023-09-23 MED ORDER — ROSUVASTATIN CALCIUM 5 MG PO TABS: 5.0000 mg | ORAL_TABLET | Freq: Every day | ORAL | 3 refills | Status: DC

## 2023-09-23 MED ORDER — GLIPIZIDE 5 MG PO TABS: 5.0000 mg | ORAL_TABLET | Freq: Two times a day (BID) | ORAL | 3 refills | Status: DC

## 2023-09-23 NOTE — Progress Notes (Signed)
Name: Shelley Dixon  Age/ Sex: 62 y.o., female   MRN/ DOB: [id number], 06-Sep-1961     PCP: Ollen Bowl, MD   Reason for Endocrinology Evaluation: Type 2 Diabetes Mellitus  Initial Endocrine Consultative Visit: 11/08/2019      PATIENT IDENTIFIER: [name] is a 62 y.o. female with a past medical history of T2Dm, HTN, CKD and Dyslipidemia . The patient has followed with Endocrinology clinic since 11/08/2019 for consultative assistance with management of her diabetes.  DIABETIC HISTORY:  [name] was diagnosed with DM many years ago. She reports intolerance to invokana - yeast infections. She recalls developing swelling  to Actos may have been swelling . Her hemoglobin A1c has ranged from 5.8% years ago , peaking at 10.9%  in 2021.  On her initial visit to our clinic she had an A1c of  10.9%  , she was on repaglinide, metformin, glimepiride and truclicity . We switched the repaglinide, and glimepiride to basal insulin, continued trulicity and metformin   Pt developed intolerance to Trulicity - vomiting and nausea. She already has reported intolerance of SGLT-2 inhibitors   Jardiance started 06/2020   Decreased Metformin due to diarrhea 02/2021  Start Ozempic 05/2021  Stopped Jardiance 03/2022 due to recurrent Uti's   Switched Ozempic to [facility] 09/2023 due to vomiting with 2 mg dose   SUBJECTIVE:   During the last visit (03/18/2023): A1c 9.9 %      Today (09/23/2023): [name] is here for a follow up on diabetes management.She has checks her blood sugars 1 times daily .  The patient has not had hypoglycemic episodes since the last clinic visit.   Denies nausea or vomiting  Denies constipation or diarrhea  She cannot tolerate Ozempic 2 mg weekly dose, but has been able to tolerate 1 mg weekly  HOME ENDOCRINE  REGIMEN:  Toujeo  40 units daily  Metformin 500 mg XR 1 tablet QAM and 2 tabs QPM  Ozempic 2 mg weekly - she takes 1 mg weekly   Rosuvastatin 5 mg daily     Statin: yes ACE-I/ARB: yes   METER DOWNLOAD SUMMARY: unable to download 116- 244 mg/dL       DIABETIC COMPLICATIONS: Microvascular complications:    Denies: retinopathy, neuropathy , CKD  Last eye exam: Completed   Macrovascular complications:    Denies: CAD, PVD, CVA    HISTORY:  Past Medical History:  Past Medical History:  Diagnosis Date   Arthritis    Cancer (HCC)    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 She has never used smokeless tobacco. She reports that she does not currently use alcohol. She reports that she does not use drugs. Family History: No family history on file.   HOME MEDICATIONS: Allergies as of 09/23/2023  Reactions   Atorvastatin    Elevated LFT   Bydureon [exenatide]    Injection site pain   Canagliflozin Other (See Comments)   Severe UTI   Jardiance [empagliflozin] Other (See Comments)   Recurrent UTIs   Pioglitazone    swelling   Saxagliptin Other (See Comments)   Not effective   Simvastatin Other (See Comments)   ineffective        Medication List        Accurate as of September 23, 2023  8:00 AM. If you have any questions, ask your nurse or doctor.          STOP taking these medications    Semaglutide (2 MG/DOSE) 8 MG/3ML Sopn Stopped by: Shelley Dixon       TAKE these medications     Accu-Chek Guide test strip Generic drug: glucose blood 1 each by Other route daily in the afternoon. Use as instructed   Accu-Chek Guide w/Device Kit 1 Device by Does not apply route daily in the afternoon.   acetaminophen 500 MG tablet Commonly known as: TYLENOL Take 1,000 mg by mouth every 6 (six) hours as needed for moderate pain or mild pain.   ALPRAZolam 0.5 MG tablet Commonly known as: XANAX Take 0.25 mg by mouth at bedtime.   atenolol 50 MG tablet Commonly known as: TENORMIN Take 100 mg by mouth at bedtime.   BD Pen Needle Nano 2nd Gen 32G X 4 MM Misc Generic drug: Insulin Pen Needle Inject 1 Device into the skin daily in the afternoon. Use as direct to inject insulin daily   CRANBERRY PO Take 2 tablets by mouth daily.   esomeprazole 40 MG capsule Commonly known as: NEXIUM Take 40 mg by mouth at bedtime.   fexofenadine 180 MG tablet Commonly known as: ALLEGRA Take 180 mg by mouth daily.   gabapentin 300 MG capsule Commonly known as: NEURONTIN Take a 300 mg capsule three times a day for two weeks following surgery.Then take a 300 mg capsule two times a day for two weeks. Then resume one 300 mg capsule once a day at bedtime.   glipiZIDE 5 MG tablet Commonly known as: GLUCOTROL Take 1 tablet (5 mg total) by mouth 2 (two) times daily before a meal. Started by: Shelley Dixon   lisinopril-hydrochlorothiazide 20-12.5 MG tablet Commonly known as: ZESTORETIC Take 2 tablets by mouth every morning.   MELATONIN PO Take 6 mg by mouth daily.   metFORMIN 500 MG 24 hr tablet Commonly known as: GLUCOPHAGE-XR Take 1 tablet (500 mg total) by mouth daily with breakfast AND 2 tablets (1,000 mg total) daily with supper.   ondansetron 4 MG tablet Commonly known as: ZOFRAN Take 4 mg by mouth daily as needed for nausea/vomiting.   OVER THE COUNTER MEDICATION Take 1 Package by mouth daily. Zipfizz with bottle of water with  vitamin b12   rosuvastatin 5 MG  tablet Commonly known as: Crestor Take 1 tablet (5 mg total) by mouth daily.   scopolamine 1 MG/3DAYS Commonly known as: TRANSDERM-SCOP Place 1 patch onto the skin as needed ([place] only).   SUPER B COMPLEX PO Take 1 tablet by mouth daily.   tirzepatide 5 MG/0.5ML Pen Commonly known as: MOUNJARO Inject 5 mg into the skin once a week. Started by: Shelley Dixon   Toujeo SoloStar 300 UNIT/ML Solostar Pen Generic drug: insulin glargine (1 Unit Dial) INJECT 40 UNITS INTO THE SKIN DAILY IN THE AFTERNOON.  OBJECTIVE:   Vital Signs: BP 122/78 (BP Location: Left Arm, Patient Position: Sitting, Cuff Size: Large)   Pulse 85   Ht 5\' 3"  (1.6 m)   Wt 211 lb (95.7 kg)   SpO2 99%   BMI 37.38 kg/m   Wt Readings from Last 3 Encounters:  09/23/23 211 lb (95.7 kg)  03/18/23 210 lb (95.3 kg)  07/05/22 198 lb 12.8 oz (90.2 kg)     Exam: General: Pt appears well and is in NAD  Lungs: Clear with good BS bilat   Heart: RRR   Abdomen: soft, nontende  Extremities: No pretibial edema.  Neuro: MS is good with appropriate affect, pt is alert and Ox3    DM foot exam:03/18/2023   The skin of the feet is intact without sores or ulcerations. The pedal pulses are 2+ on right and 2+ on left. The sensation is intact to a screening 5.07, 10 gram monofilament bilaterally    DATA REVIEWED:   In office 383 mg/DL  ASSESSMENT / PLAN / RECOMMENDATIONS:   1) Type 2 Diabetes Mellitus, Poorly  controlled, Without complications - Most recent A1c of 9.0 %. Goal A1c < 7.0 %.   - Insurance did not cover dexcom  - She developed severe nausea and vomiting to Trulicity,as well as on Ozempic 2 mg  - She is intolerant to pioglitazone -Intolerant to Jardiance due to recurrent genital infections -I have recommended starting glipizide, caution against hypoglycemia and weight gain, emphasized the importance of taking this before the first and last meal of the day  MEDICATIONS: -Start  Glipizide 5 mg twice daily  -Continue  Metfomrin 1 tablet in the morning and 2 tablets with Supper -Continue Toujeo 40 units daily  - Switch Ozempic to mounjaro 5 mg weekly     EDUCATION / INSTRUCTIONS: BG monitoring instructions: Patient is instructed to check her blood sugars 1 times a day,  fasting Call [place] [facility] if: BG persistently < 70  I reviewed the Rule of 15 for the treatment of hypoglycemia in detail with the patient. Literature supplied.    2) Diabetic complications:  Eye: Does not have known diabetic retinopathy.  Neuro/ Feet: Does not have known diabetic peripheral neuropathy .  Renal: Patient does not have known baseline CKD. She   is on an ACEI/ARB at present.      3) Dyslipidemia :   -Historically she has had LDL above goal -As well as elevated LFTs to atorvastatin -She has history of intolerance to simvastatin -I had switched pravastatin to rosuvastatin 06/2020 -LDL at goal   Medications  Continue Rosuvastatin 5 mg daily       F/U in 3 months    Signed electronically by: Lyndle Herrlich, MD  [facility] Endocrinology  [facility] [address] [place] [place], [place] [address] Phone: [phone] FAX: [phone]   CC: Ollen Bowl, MD [address] [place] [address] Phone: [phone]  Fax: [phone]  Return to Endocrinology clinic as below: No future appointments.

## 2023-09-23 NOTE — Patient Instructions (Signed)
-  Continue Metformin 500 mg, 1 tablet in the morning and 2 in the evening -Continue Toujeo 40 units daily  -Switch Ozempic to mounjaro 5 mg weekly  -Start Glipizide 5 mg, 1 tablet before Breakfast and 1 tablet before Supper       HOW TO TREAT LOW BLOOD SUGARS (Blood sugar LESS THAN 70 MG/DL) Please follow the RULE OF 15 for the treatment of hypoglycemia treatment (when your (blood sugars are less than 70 mg/dL)   STEP 1: Take 15 grams of carbohydrates when your blood sugar is low, which includes:  3-4 GLUCOSE TABS  OR 3-4 OZ OF JUICE OR REGULAR SODA OR ONE TUBE OF GLUCOSE GEL    STEP 2: RECHECK blood sugar in 15 MINUTES STEP 3: If your blood sugar is still low at the 15 minute recheck --> then, go back to STEP 1 and treat AGAIN with another 15 grams of carbohydrates.

## 2023-10-03 ENCOUNTER — Other Ambulatory Visit: Payer: Self-pay | Admitting: Internal Medicine

## 2023-10-26 ENCOUNTER — Other Ambulatory Visit: Payer: Self-pay | Admitting: Internal Medicine

## 2023-10-26 ENCOUNTER — Encounter: Payer: Self-pay | Admitting: Internal Medicine

## 2023-10-28 ENCOUNTER — Other Ambulatory Visit (HOSPITAL_COMMUNITY): Payer: Self-pay

## 2023-10-28 ENCOUNTER — Telehealth: Payer: Self-pay

## 2023-10-28 NOTE — Telephone Encounter (Signed)
Pharmacy Patient Advocate Encounter   Received notification from Patient Advice Request messages that prior authorization for Toujeo is required/requested.   Insurance verification completed.   The patient is insured through [facility] and [employer].   Per test claim: The current 30 day co-pay is, $27.62.  No PA needed at this time. This test claim was processed through [facility]- copay amounts may vary at other pharmacies due to pharmacy/plan contracts, or as the patient moves through the different stages of their insurance plan.

## 2023-12-12 ENCOUNTER — Encounter: Payer: Self-pay | Admitting: Internal Medicine

## 2023-12-12 ENCOUNTER — Other Ambulatory Visit: Payer: Self-pay | Admitting: Internal Medicine

## 2023-12-12 ENCOUNTER — Ambulatory Visit (INDEPENDENT_AMBULATORY_CARE_PROVIDER_SITE_OTHER): Payer: 59 | Admitting: Internal Medicine

## 2023-12-12 VITALS — BP 120/74 | HR 79 | Ht 63.0 in | Wt 208.0 lb

## 2023-12-12 DIAGNOSIS — E782 Mixed hyperlipidemia: Secondary | ICD-10-CM

## 2023-12-12 DIAGNOSIS — E119 Type 2 diabetes mellitus without complications: Secondary | ICD-10-CM

## 2023-12-12 DIAGNOSIS — Z794 Long term (current) use of insulin: Secondary | ICD-10-CM | POA: Diagnosis not present

## 2023-12-12 LAB — POCT GLYCOSYLATED HEMOGLOBIN (HGB A1C): Hemoglobin A1C: 6.3 % — AB (ref 4.0–5.6)

## 2023-12-12 MED ORDER — GLIPIZIDE 5 MG PO TABS: 5.0000 mg | ORAL_TABLET | Freq: Two times a day (BID) | ORAL | 3 refills | Status: DC

## 2023-12-12 MED ORDER — METFORMIN HCL ER 500 MG PO TB24: 1500.0000 mg | ORAL_TABLET | Freq: Every day | ORAL | 3 refills | Status: DC

## 2023-12-12 MED ORDER — TIRZEPATIDE 5 MG/0.5ML ~~LOC~~ SOAJ: 5.0000 mg | SUBCUTANEOUS | 3 refills | Status: DC

## 2023-12-12 MED ORDER — ROSUVASTATIN CALCIUM 5 MG PO TABS: 5.0000 mg | ORAL_TABLET | Freq: Every day | ORAL | 3 refills | Status: DC

## 2023-12-12 MED ORDER — TOUJEO SOLOSTAR 300 UNIT/ML ~~LOC~~ SOPN: 38.0000 [IU] | PEN_INJECTOR | Freq: Every day | SUBCUTANEOUS | 2 refills | Status: DC

## 2023-12-12 NOTE — Patient Instructions (Signed)
-  Continue Metformin 500 mg, 1 tablet in the morning and 2 in the evening -Decrease  Toujeo 38 units daily  -Continued mounjaro 5 mg weekly  -Continue Glipizide 5 mg, 1 tablet before Breakfast and 1 tablet before Supper       HOW TO TREAT LOW BLOOD SUGARS (Blood sugar LESS THAN 70 MG/DL) Please follow the RULE OF 15 for the treatment of hypoglycemia treatment (when your (blood sugars are less than 70 mg/dL)   STEP 1: Take 15 grams of carbohydrates when your blood sugar is low, which includes:  3-4 GLUCOSE TABS  OR 3-4 OZ OF JUICE OR REGULAR SODA OR ONE TUBE OF GLUCOSE GEL    STEP 2: RECHECK blood sugar in 15 MINUTES STEP 3: If your blood sugar is still low at the 15 minute recheck --> then, go back to STEP 1 and treat AGAIN with another 15 grams of carbohydrates.

## 2023-12-12 NOTE — Progress Notes (Signed)
 Name: Shelley Dixon  Age/ Sex: 63 y.o., female   MRN/ DOB: [id number], November 24, 1960     PCP: Ollen Bowl, MD   Reason for Endocrinology Evaluation: Type 2 Diabetes Mellitus  Initial Endocrine Consultative Visit: 11/08/2019      PATIENT IDENTIFIER: Shelley Dixon is a 63 y.o. female with a past medical history of T2Dm, HTN, CKD and Dyslipidemia . The patient has followed with Endocrinology clinic since 11/08/2019 for consultative assistance with management of her diabetes.  DIABETIC HISTORY:  Shelley Dixon was diagnosed with DM many years ago. She reports intolerance to invokana - yeast infections. She recalls developing swelling  to Actos may have been swelling . Her hemoglobin A1c has ranged from 5.8% years ago , peaking at 10.9%  in 2021.  On her initial visit to our clinic she had an A1c of  10.9%  , she was on repaglinide, metformin, glimepiride and truclicity . We switched the repaglinide, and glimepiride to basal insulin, continued trulicity and metformin   Pt developed intolerance to Trulicity - vomiting and nausea. She already has reported intolerance of SGLT-2 inhibitors   Jardiance started 06/2020   Decreased Metformin due to diarrhea 02/2021  Start Ozempic 05/2021  Stopped Jardiance 03/2022 due to recurrent Uti's   Switched Ozempic to [facility] 09/2023 due to vomiting with 2 mg dose    Started Glipizide 06/2023  SUBJECTIVE:   During the last visit (06/24/2023): A1c 9.0%      Today (12/12/2023): Shelley Dixon is here for a follow up on diabetes management.She has checks her blood sugars 1 times daily .  The patient has not had hypoglycemic episodes since the last clinic visit.   Denies nausea or vomiting  Denies constipation or diarrhea    HOME ENDOCRINE  REGIMEN:  Glipizide 5 mg BID  Metformin 500 mg XR 1 tablet QAM and 2 tabs QPM  Mounjaro 5 mg weekly Toujeo 40 units daily  Rosuvastatin 5 mg daily     Statin: yes ACE-I/ARB: yes   METER  DOWNLOAD SUMMARY: 2/24-3/07/2024  Average Number Tests/Day = 0.4 Overall Mean FS Glucose = 113   BG Ranges: Low = 100 High = 133  BG Target % Results: % In target = 100 % Over target = 0 % Under target = 0  Hypoglycemic Events/30 Days: BG < 50 = 0 Episodes of symptomatic severe hypoglycemia = 0       DIABETIC COMPLICATIONS: Microvascular complications:    Denies: retinopathy, neuropathy , CKD  Last eye exam: Completed   Macrovascular complications:    Denies: CAD, PVD, CVA    HISTORY:  Past Medical History:  Past Medical History:  Diagnosis Date   Arthritis    Cancer (HCC)    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 She has never used smokeless tobacco.  She reports that she does not currently use alcohol. She reports that she does not use drugs. Family History: No family history on file.   HOME MEDICATIONS: Allergies as of 12/12/2023       Reactions   Atorvastatin    Elevated LFT   Bydureon [exenatide]    Injection site pain   Canagliflozin Other (See Comments)   Severe UTI   Jardiance [empagliflozin] Other (See Comments)   Recurrent UTIs   Pioglitazone    swelling   Saxagliptin Other (See Comments)   Not effective   Simvastatin Other (See Comments)   ineffective        Medication List        Accurate as of December 12, 2023  8:02 AM. If you have any questions, ask  your nurse or doctor.          STOP taking these medications    acetaminophen 500 MG tablet Commonly known as: TYLENOL Stopped by: Johnney Ou Sameeha Rockefeller       TAKE these medications    Accu-Chek Guide test strip Generic drug: glucose blood 1 each by Other route daily in the afternoon. Use as instructed   Accu-Chek Guide w/Device Kit 1 Device by Does not apply route daily in the afternoon.   ALPRAZolam 0.5 MG tablet Commonly known as: XANAX Take 0.25 mg by mouth at bedtime.   atenolol 50 MG tablet Commonly known as: TENORMIN Take 100 mg by mouth at bedtime.   BD Pen Needle Nano 2nd Gen 32G X 4 MM Misc Generic drug: Insulin Pen Needle Inject 1 Device into the skin daily in the afternoon. Use as direct to inject insulin daily   CRANBERRY PO Take 2 tablets by mouth daily.   esomeprazole 40 MG capsule Commonly known as: NEXIUM Take 40 mg by mouth at bedtime.   fexofenadine 180 MG tablet Commonly known as: ALLEGRA Take 180 mg by mouth daily.   gabapentin 300 MG capsule Commonly known as: NEURONTIN Take a 300 mg capsule three times a day for two weeks following surgery.Then take a 300 mg capsule two times a day for two weeks. Then resume one 300 mg capsule once a day at bedtime.   glipiZIDE 5 MG tablet Commonly known as: GLUCOTROL Take 1 tablet (5 mg total) by mouth 2 (two) times daily before a meal.   lisinopril-hydrochlorothiazide 20-12.5 MG tablet Commonly known as: ZESTORETIC Take 2 tablets by mouth every morning.   MELATONIN PO Take 6 mg by mouth daily.   metFORMIN 500 MG 24 hr tablet Commonly known as: GLUCOPHAGE-XR TAKE 1 TABLET (500 MG TOTAL) DAILY WITH BREAKFAST AND 2 TABLETS (1,000 MG TOTAL) DAILY WITH SUPPER.   ondansetron 4 MG tablet Commonly known as: ZOFRAN Take 4 mg by mouth daily as needed for nausea/vomiting.   OVER THE COUNTER MEDICATION Take 1 Package by mouth daily. Zipfizz with bottle of water with  vitamin b12   rosuvastatin 5 MG  tablet Commonly known as: Crestor Take 1 tablet (5 mg total) by mouth daily.   scopolamine 1 MG/3DAYS Commonly known as: TRANSDERM-SCOP Place 1 patch onto the skin as needed ([place] only).   SUPER B COMPLEX PO Take 1 tablet by mouth daily.   tirzepatide 5 MG/0.5ML Pen Commonly known as: MOUNJARO Inject 5 mg into the skin once a week.   Toujeo SoloStar 300 UNIT/ML Solostar Pen Generic drug: insulin glargine (1 Unit Dial) INJECT 40 UNITS INTO THE SKIN DAILY IN THE AFTERNOON.  OBJECTIVE:   Vital Signs: BP 120/74 (BP Location: Left Arm, Patient Position: Sitting, Cuff Size: Normal)   Pulse 79   Ht 5\' 3"  (1.6 m)   Wt 208 lb (94.3 kg)   SpO2 99%   BMI 36.85 kg/m   Wt Readings from Last 3 Encounters:  12/12/23 208 lb (94.3 kg)  09/23/23 211 lb (95.7 kg)  03/18/23 210 lb (95.3 kg)     Exam: General: Pt appears well and is in NAD  Lungs: Clear with good BS bilat   Heart: RRR   Extremities: No pretibial edema.  Neuro: MS is good with appropriate affect, pt is alert and Ox3    DM foot exam:12/12/2023   The skin of the feet is intact without sores or ulcerations. The pedal pulses are 2+ on right and 2+ on left. The sensation is intact to a screening 5.07, 10 gram monofilament bilaterally    DATA REVIEWED: 04/27/2024 BUN 17 Cr. 1.04 GFR 60 LDL 52 TG 145 HDL 42     ASSESSMENT / PLAN / RECOMMENDATIONS:   1) Type 2 Diabetes Mellitus, Optimally controlled, Without complications - Most recent A1c of 6.3 %. Goal A1c < 7.0 %.   -A1c has optimized - Insurance did not cover dexcom  - She developed severe nausea and vomiting to Trulicity,as well as on Ozempic 2 mg  - She is intolerant to pioglitazone -Intolerant to Jardiance due to recurrent genital infections -Tolerating Mounjaro without side effects, patient opted to remain on current dose and not to increase at this time -Due to tight BG's, I have recommended decreasing Toujeo as  below  MEDICATIONS: -Continue Glipizide 5 mg twice daily  -Continue  Metfomrin 1 tablet in the morning and 2 tablets with Supper -Decrease Toujeo 38 units daily  -Continue  mounjaro 5 mg weekly     EDUCATION / INSTRUCTIONS: BG monitoring instructions: Patient is instructed to check her blood sugars 1 times a day,  fasting Call [facility] if: BG persistently < 70  I reviewed the Rule of 15 for the treatment of hypoglycemia in detail with the patient. Literature supplied.    2) Diabetic complications:  Eye: Does not have known diabetic retinopathy.  Neuro/ Feet: Does not have known diabetic peripheral neuropathy .  Renal: Patient does not have known baseline CKD. She   is on an ACEI/ARB at present.      3) Dyslipidemia :   -Historically she has had LDL above goal -As well as elevated LFTs to atorvastatin -She has history of intolerance to simvastatin -I had switched pravastatin to rosuvastatin 06/2020 -LDL at goal   Medications  Continue Rosuvastatin 5 mg daily       F/U in 3 months    Signed electronically by: Lyndle Herrlich, MD  [facility] End[facility]  [facility] [address] [place] [place], [place] [address] Phone: [phone] FAX: [phone]   CC: Ollen Bowl, MD [address] [place] [address] Phone: [phone]  Fax: [phone]  Return to Endocrinology clinic as below: No future appointments.

## 2024-02-24 ENCOUNTER — Other Ambulatory Visit: Payer: Self-pay | Admitting: Internal Medicine

## 2024-02-29 ENCOUNTER — Other Ambulatory Visit: Payer: Self-pay | Admitting: Internal Medicine

## 2024-03-12 ENCOUNTER — Encounter: Payer: Self-pay | Admitting: Internal Medicine

## 2024-03-12 MED ORDER — FREESTYLE LIBRE 3 PLUS SENSOR MISC: 3 refills | Status: DC

## 2024-03-12 MED ORDER — TIRZEPATIDE 7.5 MG/0.5ML ~~LOC~~ SOAJ: 7.5000 mg | SUBCUTANEOUS | 3 refills | Status: DC

## 2024-03-21 NOTE — H&P (Signed)
 TOTAL KNEE ADMISSION H&P  Patient is being admitted for right total knee arthroplasty.  Subjective:  Chief Complaint: Right knee pain.  HPI: Shelley Dixon, 63 y.o. female has a history of pain and functional disability in the right knee due to arthritis and has failed non-surgical conservative treatments for greater than 12 weeks to include NSAID's and/or analgesics, corticosteriod injections, and activity modification. Onset of symptoms was gradual, starting several years ago with gradually worsening course since that time. The patient noted no past surgery on the right knee.  Patient currently rates pain in the right knee at 8 out of 10 with activity. Patient has worsening of pain with activity and weight bearing and pain that interferes with activities of daily living. Patient has evidence of periarticular osteophytes and joint space narrowing by imaging studies. There is no active infection.  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 Authorizing Provider  ALPRAZolam  (XANAX ) 0.5 MG tablet Take 0.25 mg by mouth at bedtime. 07/10/19   [provider]  atenolol  (TENORMIN ) 50 MG tablet Take 100 mg by mouth at bedtime. 09/22/19   [provider]  B Complex-C (SUPER B COMPLEX PO) Take 1 tablet by mouth daily.    [provider]  Blood Glucose Monitoring Suppl (ACCU-CHEK GUIDE) w/Device KIT 1 Device by Does not apply route daily in the afternoon. 03/18/23   Shamleffer, Ibtehal Jaralla, MD  Continuous Glucose Sensor (FREESTYLE LIBRE 3 PLUS SENSOR) MISC Change sensor every 15 days. 03/12/24   Shamleffer, Ibtehal Jaralla, MD  CRANBERRY PO Take 2 tablets by mouth daily.    [provider]  esomeprazole (NEXIUM) 40 MG capsule Take 40 mg by mouth at bedtime. 10/14/15   [provider]  fexofenadine (ALLEGRA) 180 MG tablet Take 180 mg by mouth daily.    [provider]  gabapentin  (NEURONTIN ) 300 MG capsule Take a 300 mg capsule three times a day for two weeks following surgery.Then take a 300 mg capsule two times a day for two weeks. Then resume one 300 mg capsule once a day at bedtime. 07/06/22   Edmisten, Onesimo Bijou, PA  glipiZIDE  (GLUCOTROL ) 5 MG tablet Take 1 tablet (5 mg total) by mouth 2 (two) times daily before a meal. 12/12/23   Shamleffer, Julian Obey, MD  glucose blood (ACCU-CHEK GUIDE) test strip  1 each by Other route daily in the afternoon. Use as instructed 03/18/23   Shamleffer, Ibtehal Jaralla, MD  insulin  glargine, 1 Unit Dial, (TOUJEO  SOLOSTAR) 300 UNIT/ML Solostar Pen Inject 38 Units into the skin daily in the afternoon. 02/24/24   Shamleffer, Ibtehal Jaralla, MD  Insulin  Pen Needle (BD PEN NEEDLE NANO 2ND GEN) 32G X 4 MM MISC INJECT 1 DEVICE INTO THE SKIN DAILY IN THE AFTERNOON. USE AS DIRECT TO INJECT INSULIN  DAILY 03/01/24   Shamleffer, Ibtehal Jaralla, MD   lisinopril-hydrochlorothiazide (ZESTORETIC) 20-12.5 MG tablet Take 2 tablets by mouth every morning. 06/05/22   [provider]  MELATONIN PO Take 6 mg by mouth daily.    [provider]  metFORMIN  (GLUCOPHAGE -XR) 500 MG 24 hr tablet Take 3 tablets (1,500 mg total) by mouth daily with breakfast. 12/12/23   Shamleffer, Ibtehal Jaralla, MD  ondansetron  (ZOFRAN ) 4 MG tablet Take 4 mg by mouth daily as needed for nausea/vomiting. 06/15/22   [provider]  OVER THE COUNTER MEDICATION Take 1 Package by mouth daily. Zipfizz with bottle of water with  vitamin b12    [provider]  rosuvastatin  (CRESTOR ) 5 MG tablet Take 1 tablet (5 mg total) by mouth daily. 12/12/23   Shamleffer, Ibtehal Jaralla, MD  scopolamine (TRANSDERM-SCOP) 1 MG/3DAYS Place 1 patch onto the skin as needed ([place] only). 12/23/21   [provider]  tirzepatide  (MOUNJARO ) 7.5 MG/0.5ML Pen Inject 7.5 mg into the skin once a week. 03/12/24   Shamleffer, Ibtehal Jaralla, MD    Allergies  Allergen Reactions   Atorvastatin     Elevated LFT   Bydureon [Exenatide]     Injection site pain   Canagliflozin Other (See Comments)    Severe UTI   Jardiance  [Empagliflozin ] Other (See Comments)    Recurrent UTIs   Pioglitazone     swelling   Saxagliptin Other (See Comments)    Not effective   Simvastatin Other (See Comments)    ineffective    Social History   Socioeconomic History   Marital status: Married    Spouse name: Not on file   Number of children: Not on file   Years of education: Not on file   Highest education level: Not on file  Occupational History   Not on file  Tobacco Use   Smoking status: Former   Smokeless tobacco: Never  Vaping Use   Vaping status: Never Used  Substance and Sexual Activity   Alcohol use: Not Currently    Comment: rare   Drug use: Never   Sexual activity: Not on file  Other Topics Concern   Not on file  Social History Narrative   Not on file    Social Drivers of Health   Financial Resource Strain: Not on file  Food Insecurity: No Food Insecurity (07/05/2022)   Hunger Vital Sign    Worried About Running Out of Food in the Last Year: Never true    Ran Out of Food in the Last Year: Never true  Transportation Needs: No Transportation Needs (07/05/2022)   PRAPARE - Administrator, Civil Service (Medical): No    Lack of Transportation (Non-Medical): No  Physical Activity: Not on file  Stress: Not on file  Social Connections: Not on file  Intimate Partner Violence: Not At Risk (07/05/2022)   Humiliation, Afraid, Rape, and Kick questionnaire    Fear of Current or Ex-Partner: No    Emotionally Abused: No    Physically Abused: No  Sexually Abused: No    Tobacco Use: Medium Risk (12/12/2023)   Patient History    Smoking Tobacco Use: Former    Smokeless Tobacco Use: Never    Passive Exposure: Not on file   Social History   Substance and Sexual Activity  Alcohol Use Not Currently   Comment: rare    No family history on file.  ROS  Objective:  Physical Exam: - Well-developed female, alert, oriented, no apparent distress.    - Evaluation of her left knee shows a well-healed scar from previous arthroplasty. Range of motion 0-130 with no tenderness or instability.    - Right knee shows no effusion. She has slight valgus on the right. Her range of motion is about 5-120 with crepitus on range of motion, tender medial and lateral with no instability. Her gait pattern is antalgic on the right.    IMAGING:   - AP both knees, lateral of the right knee. The prosthesis on the left is in excellent position with no periprosthetic abnormalities. On the right, there is central spurring and central bone-on-bone on the AP view with spurring laterally. On the lateral view, she has bone-on-bone in the patellofemoral compartment.  Assessment/Plan:  End stage arthritis, right knee   The patient history, physical  examination, clinical judgment of the provider and imaging studies are consistent with end stage degenerative joint disease of the right knee and total knee arthroplasty is deemed medically necessary. The treatment options including medical management, injection therapy arthroscopy and arthroplasty were discussed at length. The risks and benefits of total knee arthroplasty were presented and reviewed. The risks due to aseptic loosening, infection, stiffness, patella tracking problems, thromboembolic complications and other imponderables were discussed. The patient acknowledged the explanation, agreed to proceed with the plan and consent was signed. Patient is being admitted for inpatient treatment for surgery, pain control, PT, OT, prophylactic antibiotics, VTE prophylaxis, progressive ambulation and ADLs and discharge planning. The patient is planning to be discharged home with husband.   Patient's anticipated LOS is less than 2 midnights, meeting these requirements: - Younger than 31 - Lives within 1 hour of care - Has a competent adult at home to recover with post-op recover - NO history of  - Chronic pain requiring opiods  - Coronary Artery Disease  - Heart failure  - Heart attack  - Stroke  - DVT/VTE  - Cardiac arrhythmia  - Respiratory Failure/COPD  - Renal failure  - Anemia  - Advanced Liver disease   Therapy Plans: EO [place] Disposition: Home with husband Planned DVT Prophylaxis: Aspirin  81mg  BID  DME Needed: None PCP: Shaunna Delaware, MD (clearance received) TXA: IV Allergies: Actos (swelling), atorvastatin (elevated LFTs), Bydureon (injection site swelling), canagliflozin & empagliflozin  (UTIs), exenatide (injection site pain), invokana, onglyza, pioglutazone, saxagliptin, trulicity Anesthesia Concerns: None BMI: 36.7 Last HgbA1c: 6.4% on 01/27/24 Pharmacy: [facility] [address] in [place]    - Patient was instructed on what medications to stop prior to surgery. - Follow-up  visit in 2 weeks with Dr. France Ina - Begin physical therapy following surgery - Pre-operative lab work as pre-surgical testing - Prescriptions will be provided in hospital at time of discharge  Angelo Kennedy, PA-C Orthopedic Surgery [facility] [place]

## 2024-03-29 NOTE — Progress Notes (Addendum)
  Date of COVID positive in last 90 days:  No  PCP - Dr. Auston Cardiologist - N/A Endocrinologist - Donell Cardinal, MD  Medical clearance under media  Chest x-ray - N/A EKG - 01-30-24 copy on chart Stress Test - N/A ECHO - N/A Cardiac Cath - N/A Pacemaker/ICD device last checked:N/A Spinal Cord Stimulator:N/A  Bowel Prep - N/A  Sleep Study - N/A CPAP -   Fasting Blood Sugar - 90 to 130 Checks Blood Sugar - 1 time a day  Mounjaro  Last dose of GLP1 agonist-  N/A GLP1 instructions:  Do not take after  04-08-24, patient advised   Last dose of SGLT-2 inhibitors-  N/A SGLT-2 instructions:  Do not take after    Blood Thinner Instructions: N/A: Aspirin  Instructions: Last Dose:  Activity level:  Can go up a flight of stairs and perform activities of daily living without stopping and without symptoms of chest pain or shortness of breath.  Anesthesia review: N/A  Patient denies shortness of breath, fever, cough and chest pain at PAT appointment  Patient verbalized understanding of instructions that were given to them at the PAT appointment. Patient was also instructed that they will need to review over the PAT instructions again at home before surgery.

## 2024-03-29 NOTE — Patient Instructions (Addendum)
 SURGICAL WAITING ROOM VISITATION Patients having surgery or a procedure may have no more than 2 support people in the waiting area - these visitors may rotate.    Children under the age of 62 must have an adult with them who is not the patient.  If the patient needs to stay at the hospital during part of their recovery, the visitor guidelines for inpatient rooms apply. Pre-op nurse will coordinate an appropriate time for 1 support person to accompany patient in pre-op.  This support person may not rotate.    Please refer to the [employer] website for the visitor guidelines for Inpatients (after your surgery is over and you are in a regular room).       Your procedure is scheduled on: 04-16-24   Report to [facility] Main Entrance    Report to admitting at 5:15 AM   Call this number if you have problems the morning of surgery [phone]   Do not eat food :After Midnight.   After Midnight you may have the following liquids until 4:15 AM DAY OF SURGERY  Water Non-Citrus Juices (without pulp, NO RED-Apple, White grape, White cranberry) Black Coffee (NO MILK/CREAM OR CREAMERS, sugar ok)  Clear Tea (NO MILK/CREAM OR CREAMERS, sugar ok) regular and decaf                             Plain Jell-O (NO RED)                                           Fruit ices (not with fruit pulp, NO RED)                                     Popsicles (NO RED)                                                               Sports drinks like Gatorade (NO RED)                   The day of surgery:  Drink ONE (1) Pre-Surgery G2 by 4:15 AM the morning of surgery. Drink in one sitting. Do not sip.  This drink was given to you during your hospital  pre-op appointment visit. Nothing else to drink after completing the Pre-Surgery G2.          If you have questions, please contact your surgeon's office.   FOLLOW  ANY ADDITIONAL PRE OP INSTRUCTIONS YOU RECEIVED FROM YOUR SURGEON'S OFFICE!!!      Oral Hygiene is also important to reduce your risk of infection.                                    Remember - BRUSH YOUR TEETH THE MORNING OF SURGERY WITH YOUR REGULAR TOOTHPASTE   Do NOT smoke after Midnight   Take these medicines the morning of surgery with A SIP OF WATER:    Allegra   Gabapentin   Rosuvastatin   Stop all vitamins and herbal supplements 7 days before surgery  How to Manage Your Diabetes Before and After Surgery  Why is it important to control my blood sugar before and after surgery? Improving blood sugar levels before and after surgery helps healing and can limit problems. A way of improving blood sugar control is eating a healthy diet by:  Eating less sugar and carbohydrates  Increasing activity/exercise  Talking with your doctor about reaching your blood sugar goals High blood sugars (greater than 180 mg/dL) can raise your risk of infections and slow your recovery, so you will need to focus on controlling your diabetes during the weeks before surgery. Make sure that the doctor who takes care of your diabetes knows about your planned surgery including the date and location.  How do I manage my blood sugar before surgery? Check your blood sugar at least 4 times a day, starting 2 days before surgery, to make sure that the level is not too high or low. Check your blood sugar the morning of your surgery when you wake up and every 2 hours until you get to the Short Stay unit. If your blood sugar is less than 70 mg/dL, you will need to treat for low blood sugar: Do not take insulin . Treat a low blood sugar (less than 70 mg/dL) with  cup of clear juice (cranberry or apple), 4 glucose tablets, OR glucose gel. Recheck blood sugar in 15 minutes after treatment (to make sure it is greater than 70 mg/dL). If your blood sugar is not greater than 70 mg/dL on recheck, call [phone] for further instructions. Report your blood sugar to the short stay nurse when you get to  Short Stay.  If you are admitted to the hospital after surgery: Your blood sugar will be checked by the staff and you will probably be given insulin  after surgery (instead of oral diabetes medicines) to make sure you have good blood sugar levels. The goal for blood sugar control after surgery is 80-180 mg/dL.   WHAT DO I DO ABOUT MY DIABETES MEDICATION?  Do not take oral diabetes medicines (pills) the morning of surgery.        Hold Mounjaro  7 days before surgery (do not take after 04-08-24)        Glipizide  - no evening dose the night before surgery   THE NIGHT BEFORE SURGERY, take 19 units of Toujeo .  DO NOT TAKE THE FOLLOWING 7 DAYS PRIOR TO SURGERY: Ozempic , Wegovy , Rybelsus  (Semaglutide ), Byetta (exenatide), Bydureon (exenatide ER), Victoza, Saxenda (liraglutide), or Trulicity (dulaglutide) Mounjaro  (Tirzepatide ) Adlyxin (Lixisenatide), Polyethylene Glycol Loxenatide.  Reviewed and Endorsed by [facility] Patient Education Committee, August 2015                              You may not have any metal on your body including hair pins, jewelry, and body piercing             Do not wear make-up, lotions, powders, perfumes, or deodorant  Do not wear nail polish including gel and S&S, artificial/acrylic nails, or any other type of covering on natural nails including finger and toenails. If you have artificial nails, gel coating, etc. that needs to be removed by a nail salon please have this removed prior to surgery or surgery may need to be canceled/ delayed if the surgeon/ anesthesia feels like they are unable to be safely monitored.   Do not shave  48 hours prior to surgery.    Do not bring valuables to the hospital. [facility] IS NOT RESPONSIBLE   FOR VALUABLES.   Contacts, dentures or bridgework may not be worn into surgery.   Bring small overnight bag day of surgery.   DO NOT BRING YOUR HOME MEDICATIONS TO THE HOSPITAL. PHARMACY WILL DISPENSE MEDICATIONS LISTED ON YOUR  MEDICATION LIST TO YOU DURING YOUR ADMISSION IN THE HOSPITAL!              Please read over the following fact sheets you were given: IF YOU HAVE QUESTIONS ABOUT YOUR PRE-OP INSTRUCTIONS PLEASE CALL [phone] Shelley Dixon  If you received a COVID test during your pre-op visit  it is requested that you wear a mask when out in public, stay away from anyone that may not be feeling well and notify your surgeon if you develop symptoms. If you test positive for Covid or have been in contact with anyone that has tested positive in the last 10 days please notify you surgeon.    Pre-operative 5 CHG Bath Instructions   You can play a key role in reducing the risk of infection after surgery. Your skin needs to be as free of germs as possible. You can reduce the number of germs on your skin by washing with CHG (chlorhexidine  gluconate) soap before surgery. CHG is an antiseptic soap that kills germs and continues to kill germs even after washing.   DO NOT use if you have an allergy to chlorhexidine /CHG or antibacterial soaps. If your skin becomes reddened or irritated, stop using the CHG and notify one of our RNs at [phone].   Please shower with the CHG soap starting 4 days before surgery using the following schedule:     Please keep in mind the following:  DO NOT shave, including legs and underarms, starting the day of your first shower.   You may shave your face at any point before/day of surgery.  Place clean sheets on your bed the day you start using CHG soap. Use a clean washcloth (not used since being washed) for each shower. DO NOT sleep with pets once you start using the CHG.   CHG Shower Instructions:  If you choose to wash your hair and private area, wash first with your normal shampoo/soap.  After you use shampoo/soap, rinse your hair and body thoroughly to remove shampoo/soap residue.  Turn the water OFF and apply about 3 tablespoons (45 ml) of CHG soap to a CLEAN washcloth.  Apply CHG  soap ONLY FROM YOUR NECK DOWN TO YOUR TOES (washing for 3-5 minutes)  DO NOT use CHG soap on face, private areas, open wounds, or sores.  Pay special attention to the area where your surgery is being performed.  If you are having back surgery, having someone wash your back for you may be helpful. Wait 2 minutes after CHG soap is applied, then you may rinse off the CHG soap.  Pat dry with a clean towel  Put on clean clothes/pajamas   If you choose to wear lotion, please use ONLY the CHG-compatible lotions on the back of this paper.     Additional instructions for the day of surgery: DO NOT APPLY any lotions, deodorants, cologne, or perfumes.   Put on clean/comfortable clothes.  Brush your teeth.  Ask your nurse before applying any prescription medications to the skin.      CHG Compatible Lotions   Aveeno Moisturizing lotion  Cetaphil Moisturizing Cream  Cetaphil Moisturizing  Lotion  Clairol Herbal Essence Moisturizing Lotion, Dry Skin  Clairol Herbal Essence Moisturizing Lotion, Extra Dry Skin  Clairol Herbal Essence Moisturizing Lotion, Normal Skin  Curel Age Defying Therapeutic Moisturizing Lotion with Alpha Hydroxy  Curel Extreme Care Body Lotion  Curel Soothing Hands Moisturizing Hand Lotion  Curel Therapeutic Moisturizing Cream, Fragrance-Free  Curel Therapeutic Moisturizing Lotion, Fragrance-Free  Curel Therapeutic Moisturizing Lotion, Original Formula  Eucerin Daily Replenishing Lotion  Eucerin Dry Skin Therapy Plus Alpha Hydroxy Crme  Eucerin Dry Skin Therapy Plus Alpha Hydroxy Lotion  Eucerin Original Crme  Eucerin Original Lotion  Eucerin Plus Crme Eucerin Plus Lotion  Eucerin TriLipid Replenishing Lotion  Keri Anti-Bacterial Hand Lotion  Keri Deep Conditioning Original Lotion Dry Skin Formula Softly Scented  Keri Deep Conditioning Original Lotion, Fragrance Free Sensitive Skin Formula  Keri Lotion Fast Absorbing Fragrance Free Sensitive Skin Formula  Keri  Lotion Fast Absorbing Softly Scented Dry Skin Formula  Keri Original Lotion  Keri Skin Renewal Lotion Keri Silky Smooth Lotion  Keri Silky Smooth Sensitive Skin Lotion  Nivea Body Creamy Conditioning Oil  Nivea Body Extra Enriched Lotion  Nivea Body Original Lotion  Nivea Body Sheer Moisturizing Lotion Nivea Crme  Nivea Skin Firming Lotion  NutraDerm 30 Skin Lotion  NutraDerm Skin Lotion  NutraDerm Therapeutic Skin Cream  NutraDerm Therapeutic Skin Lotion  ProShield Protective Hand Cream  Provon moisturizing lotion   PATIENT SIGNATURE_________________________________  NURSE SIGNATURE__________________________________  ________________________________________________________________________    Shelley Dixon  An incentive spirometer is a tool that can help keep your lungs clear and active. This tool measures how well you are filling your lungs with each breath. Taking long deep breaths may help reverse or decrease the chance of developing breathing (pulmonary) problems (especially infection) following: A long period of time when you are unable to move or be active. BEFORE THE PROCEDURE  If the spirometer includes an indicator to show your best effort, your nurse or respiratory therapist will set it to a desired goal. If possible, sit up straight or lean slightly forward. Try not to slouch. Hold the incentive spirometer in an upright position. INSTRUCTIONS FOR USE  Sit on the edge of your bed if possible, or sit up as far as you can in bed or on a chair. Hold the incentive spirometer in an upright position. Breathe out normally. Place the mouthpiece in your mouth and seal your lips tightly around it. Breathe in slowly and as deeply as possible, raising the piston or the ball toward the top of the column. Hold your breath for 3-5 seconds or for as long as possible. Allow the piston or ball to fall to the bottom of the column. Remove the mouthpiece from your mouth and breathe  out normally. Rest for a few seconds and repeat Steps 1 through 7 at least 10 times every 1-2 hours when you are awake. Take your time and take a few normal breaths between deep breaths. The spirometer may include an indicator to show your best effort. Use the indicator as a goal to work toward during each repetition. After each set of 10 deep breaths, practice coughing to be sure your lungs are clear. If you have an incision (the cut made at the time of surgery), support your incision when coughing by placing a pillow or rolled up towels firmly against it. Once you are able to get out of bed, walk around indoors and cough well. You may stop using the incentive spirometer when instructed by your caregiver.  RISKS AND  COMPLICATIONS Take your time so you do not get dizzy or light-headed. If you are in pain, you may need to take or ask for pain medication before doing incentive spirometry. It is harder to take a deep breath if you are having pain. AFTER USE Rest and breathe slowly and easily. It can be helpful to keep track of a log of your progress. Your caregiver can provide you with a simple table to help with this. If you are using the spirometer at home, follow these instructions: SEEK MEDICAL CARE IF:  You are having difficultly using the spirometer. You have trouble using the spirometer as often as instructed. Your pain medication is not giving enough relief while using the spirometer. You develop fever of 100.5 F (38.1 C) or higher. SEEK IMMEDIATE MEDICAL CARE IF:  You cough up bloody sputum that had not been present before. You develop fever of 102 F (38.9 C) or greater. You develop worsening pain at or near the incision site. MAKE SURE YOU:  Understand these instructions. Will watch your condition. Will get help right away if you are not doing well or get worse. Document Released: 01/31/2007 Document Revised: 12/13/2011 Document Reviewed: 04/03/2007 [facility] Patient Information  2014 [place], [place].   ________________________________________________________________________

## 2024-04-03 ENCOUNTER — Other Ambulatory Visit: Payer: Self-pay

## 2024-04-03 ENCOUNTER — Encounter (HOSPITAL_COMMUNITY)
Admission: RE | Admit: 2024-04-03 | Discharge: 2024-04-03 | Disposition: A | Discharge status: Home / Self Care | Source: Ambulatory Visit | Attending: Orthopedic Surgery | Admitting: Orthopedic Surgery

## 2024-04-03 ENCOUNTER — Encounter (HOSPITAL_COMMUNITY): Payer: Self-pay

## 2024-04-03 VITALS — BP 140/87 | HR 85 | Temp 98.5°F | Resp 12 | Ht 63.0 in | Wt 203.0 lb

## 2024-04-03 DIAGNOSIS — Z01818 Encounter for other preprocedural examination: Secondary | ICD-10-CM

## 2024-04-03 DIAGNOSIS — E119 Type 2 diabetes mellitus without complications: Secondary | ICD-10-CM | POA: Insufficient documentation

## 2024-04-03 DIAGNOSIS — Z01812 Encounter for preprocedural laboratory examination: Secondary | ICD-10-CM | POA: Insufficient documentation

## 2024-04-03 DIAGNOSIS — Z794 Long term (current) use of insulin: Secondary | ICD-10-CM | POA: Insufficient documentation

## 2024-04-03 LAB — BASIC METABOLIC PANEL WITH GFR
Anion gap: 12 (ref 5–15)
BUN: 22 mg/dL (ref 8–23)
CO2: 25 mmol/L (ref 22–32)
Calcium: 11.1 mg/dL — ABNORMAL HIGH (ref 8.9–10.3)
Chloride: 100 mmol/L (ref 98–111)
Creatinine, Ser: 1.09 mg/dL — ABNORMAL HIGH (ref 0.44–1.00)
GFR, Estimated: 57 mL/min — ABNORMAL LOW (ref >60)
Glucose, Bld: 160 mg/dL — ABNORMAL HIGH (ref 70–99)
Potassium: 4 mmol/L (ref 3.5–5.1)
Sodium: 137 mmol/L (ref 135–145)

## 2024-04-03 LAB — CBC
HCT: 38.2 % (ref 36.0–46.0)
Hemoglobin: 12.7 g/dL (ref 12.0–15.0)
MCH: 30 pg (ref 26.0–34.0)
MCHC: 33.2 g/dL (ref 30.0–36.0)
MCV: 90.3 fL (ref 80.0–100.0)
Platelets: 207 10*3/uL (ref 150–400)
RBC: 4.23 MIL/uL (ref 3.87–5.11)
RDW: 12.8 % (ref 11.5–15.5)
WBC: 8.8 10*3/uL (ref 4.0–10.5)
nRBC: 0 % (ref 0.0–0.2)

## 2024-04-03 LAB — GLUCOSE, CAPILLARY: Glucose-Capillary: 153 mg/dL — ABNORMAL HIGH (ref 70–99)

## 2024-04-03 LAB — HEMOGLOBIN A1C
Hgb A1c MFr Bld: 6 % — ABNORMAL HIGH (ref 4.8–5.6)
Mean Plasma Glucose: 125.5 mg/dL

## 2024-04-03 LAB — SURGICAL PCR SCREEN
MRSA, PCR: NEGATIVE
Staphylococcus aureus: POSITIVE — AB

## 2024-04-03 NOTE — Progress Notes (Signed)
 PCR results sent to Dr. Lequita Halt to review.  ?

## 2024-04-15 NOTE — Anesthesia Preprocedure Evaluation (Signed)
 Anesthesia Evaluation  Patient identified by MRN, date of birth, ID band Patient awake    Reviewed: Allergy & Precautions, H&P , NPO status , Patient's Chart, lab work & pertinent test results  Airway Mallampati: II  TM Distance: >3 FB Neck ROM: Full    Dental  (+) Edentulous Upper, Edentulous Lower, Implants, Upper Dentures, Lower Dentures, Dental Advisory Given   Pulmonary former smoker   breath sounds clear to auscultation       Cardiovascular Exercise Tolerance: Good hypertension, Pt. on medications and Pt. on home beta blockers  Rhythm:Regular Rate:Normal     Neuro/Psych negative neurological ROS  negative psych ROS   GI/Hepatic negative GI ROS, Neg liver ROS,GERD  ,,  Endo/Other  diabetes, Well Controlled, Type 2    Renal/GU negative Renal ROS     Musculoskeletal  (+) Arthritis ,    Abdominal  (+) + obese  Peds  Hematology negative hematology ROS (+)   Anesthesia Other Findings   Reproductive/Obstetrics negative OB ROS                              Anesthesia Physical Anesthesia Plan  ASA: 3  Anesthesia Plan: MAC, Regional and Spinal   Post-op Pain Management: Tylenol  PO (pre-op)*, Celebrex PO (pre-op)* and Regional block*   Induction: Intravenous  PONV Risk Score and Plan: 3 and Propofol  infusion, Midazolam , TIVA and Treatment may vary due to age or medical condition  Airway Management Planned: Natural Airway and Nasal Cannula  Additional Equipment: None  Intra-op Plan:   Post-operative Plan:   Informed Consent: I have reviewed the patients History and Physical, chart, labs and discussed the procedure including the risks, benefits and alternatives for the proposed anesthesia with the patient or authorized representative who has indicated his/her understanding and acceptance.     Dental advisory given  Plan Discussed with: CRNA  Anesthesia Plan Comments:  (  Risks of anesthesia explained at length. This includes, but is not limited to, sore throat, damage to teeth, lips gums, tongue and vocal cords, nausea and vomiting, reactions to medications, stroke, heart attack, and death. All patient questions were answered and the patient wishes to proceed. Risks of peripheral nerve block and spinal explained at length. This includes, but is not limited to, bleeding, infection, reactions to the medications, seizures, damage to surrounding structures, damage to nerves, permanent weakness, numbness, tingling and pain. All patient questions were answered and patient wishes to proceed with nerve block and spinal. )         Anesthesia Quick Evaluation

## 2024-04-16 ENCOUNTER — Encounter (HOSPITAL_COMMUNITY): Payer: Self-pay | Admitting: Orthopedic Surgery

## 2024-04-16 ENCOUNTER — Other Ambulatory Visit: Payer: Self-pay

## 2024-04-16 ENCOUNTER — Observation Stay (HOSPITAL_COMMUNITY)
Admission: RE | Admit: 2024-04-16 | Discharge: 2024-04-17 | Disposition: A | Discharge status: Home / Self Care | Source: Ambulatory Visit | Attending: Orthopedic Surgery | Admitting: Orthopedic Surgery

## 2024-04-16 ENCOUNTER — Ambulatory Visit (HOSPITAL_BASED_OUTPATIENT_CLINIC_OR_DEPARTMENT_OTHER): Admitting: Anesthesiology

## 2024-04-16 ENCOUNTER — Ambulatory Visit (HOSPITAL_COMMUNITY): Admitting: Anesthesiology

## 2024-04-16 ENCOUNTER — Encounter (HOSPITAL_COMMUNITY)
Admission: RE | Disposition: A | Discharge status: Home / Self Care | Payer: Self-pay | Source: Ambulatory Visit | Attending: Orthopedic Surgery

## 2024-04-16 DIAGNOSIS — I10 Essential (primary) hypertension: Secondary | ICD-10-CM | POA: Diagnosis not present

## 2024-04-16 DIAGNOSIS — Z7982 Long term (current) use of aspirin: Secondary | ICD-10-CM | POA: Diagnosis not present

## 2024-04-16 DIAGNOSIS — E119 Type 2 diabetes mellitus without complications: Secondary | ICD-10-CM | POA: Insufficient documentation

## 2024-04-16 DIAGNOSIS — Z794 Long term (current) use of insulin: Secondary | ICD-10-CM

## 2024-04-16 DIAGNOSIS — Z87891 Personal history of nicotine dependence: Secondary | ICD-10-CM

## 2024-04-16 DIAGNOSIS — M1711 Unilateral primary osteoarthritis, right knee: Principal | ICD-10-CM | POA: Insufficient documentation

## 2024-04-16 DIAGNOSIS — M179 Osteoarthritis of knee, unspecified: Principal | ICD-10-CM | POA: Diagnosis present

## 2024-04-16 HISTORY — PX: TOTAL KNEE ARTHROPLASTY: SHX125

## 2024-04-16 LAB — GLUCOSE, CAPILLARY
Glucose-Capillary: 130 mg/dL — ABNORMAL HIGH (ref 70–99)
Glucose-Capillary: 100 mg/dL — ABNORMAL HIGH (ref 70–99)
Glucose-Capillary: 142 mg/dL — ABNORMAL HIGH (ref 70–99)
Glucose-Capillary: 348 mg/dL — ABNORMAL HIGH (ref 70–99)
Glucose-Capillary: 351 mg/dL — ABNORMAL HIGH (ref 70–99)

## 2024-04-16 SURGERY — ARTHROPLASTY, KNEE, TOTAL
Anesthesia: Monitor Anesthesia Care | Site: Knee | Laterality: Right

## 2024-04-16 MED ORDER — DROPERIDOL 2.5 MG/ML IJ SOLN: 0.6250 mg | Freq: Once | INTRAMUSCULAR | Status: DC | PRN

## 2024-04-16 MED ORDER — BISACODYL 10 MG RE SUPP: 10.0000 mg | Freq: Every day | RECTAL | Status: DC | PRN

## 2024-04-16 MED ORDER — CETIRIZINE HCL 10 MG PO TABS
10.0000 mg | ORAL_TABLET | Freq: Every day | ORAL | Status: DC
  Administered 2024-04-16: 10 mg via ORAL
  Filled 2024-04-16: qty 1

## 2024-04-16 MED ORDER — ACETAMINOPHEN 325 MG PO TABS: 325.0000 mg | ORAL_TABLET | Freq: Four times a day (QID) | ORAL | Status: DC | PRN

## 2024-04-16 MED ORDER — INSULIN ASPART 100 UNIT/ML IJ SOLN: 0.0000 [IU] | INTRAMUSCULAR | Status: DC | PRN

## 2024-04-16 MED ORDER — CHLORHEXIDINE GLUCONATE 0.12 % MT SOLN
15.0000 mL | Freq: Once | OROMUCOSAL | Status: AC
  Administered 2024-04-16: 15 mL via OROMUCOSAL

## 2024-04-16 MED ORDER — ROPIVACAINE HCL 5 MG/ML IJ SOLN
INTRAMUSCULAR | Status: DC | PRN
Start: 2024-04-16 — End: 2024-04-16
  Administered 2024-04-16: 30 mL via PERINEURAL

## 2024-04-16 MED ORDER — HYDROMORPHONE HCL 1 MG/ML IJ SOLN: 0.2500 mg | INTRAMUSCULAR | Status: DC | PRN

## 2024-04-16 MED ORDER — PROPOFOL 1000 MG/100ML IV EMUL
INTRAVENOUS | Status: AC
  Filled 2024-04-16: qty 100

## 2024-04-16 MED ORDER — DEXAMETHASONE SODIUM PHOSPHATE 10 MG/ML IJ SOLN
10.0000 mg | Freq: Once | INTRAMUSCULAR | Status: AC
  Administered 2024-04-17: 10 mg via INTRAVENOUS
  Filled 2024-04-16: qty 1

## 2024-04-16 MED ORDER — INSULIN GLARGINE-YFGN 100 UNIT/ML ~~LOC~~ SOLN
38.0000 [IU] | Freq: Every day | SUBCUTANEOUS | Status: DC
  Administered 2024-04-16: 38 [IU] via SUBCUTANEOUS
  Filled 2024-04-16 (×2): qty 0.38

## 2024-04-16 MED ORDER — GLIPIZIDE 5 MG PO TABS
5.0000 mg | ORAL_TABLET | Freq: Two times a day (BID) | ORAL | Status: DC
  Administered 2024-04-16 – 2024-04-17 (×2): 5 mg via ORAL
  Filled 2024-04-16 (×2): qty 1

## 2024-04-16 MED ORDER — METHOCARBAMOL 500 MG PO TABS
500.0000 mg | ORAL_TABLET | Freq: Four times a day (QID) | ORAL | Status: DC | PRN
  Administered 2024-04-16 – 2024-04-17 (×3): 500 mg via ORAL
  Filled 2024-04-16 (×3): qty 1

## 2024-04-16 MED ORDER — MUPIROCIN 2 % EX OINT: 1.0000 | TOPICAL_OINTMENT | Freq: Two times a day (BID) | CUTANEOUS | 0 refills | Status: AC

## 2024-04-16 MED ORDER — METOCLOPRAMIDE HCL 5 MG PO TABS: 5.0000 mg | ORAL_TABLET | Freq: Three times a day (TID) | ORAL | Status: DC | PRN

## 2024-04-16 MED ORDER — 0.9 % SODIUM CHLORIDE (POUR BTL) OPTIME
TOPICAL | Status: DC | PRN
  Administered 2024-04-16: 1000 mL

## 2024-04-16 MED ORDER — DEXAMETHASONE SODIUM PHOSPHATE 10 MG/ML IJ SOLN
INTRAMUSCULAR | Status: AC
  Filled 2024-04-16: qty 1

## 2024-04-16 MED ORDER — ASPIRIN 81 MG PO CHEW
81.0000 mg | CHEWABLE_TABLET | Freq: Two times a day (BID) | ORAL | Status: DC
  Administered 2024-04-17: 81 mg via ORAL
  Filled 2024-04-16: qty 1

## 2024-04-16 MED ORDER — ROSUVASTATIN CALCIUM 5 MG PO TABS
5.0000 mg | ORAL_TABLET | Freq: Every day | ORAL | Status: DC
  Administered 2024-04-17: 5 mg via ORAL
  Filled 2024-04-16: qty 1

## 2024-04-16 MED ORDER — SODIUM CHLORIDE 0.9 % IR SOLN
Status: DC | PRN
  Administered 2024-04-16: 1000 mL

## 2024-04-16 MED ORDER — PANTOPRAZOLE SODIUM 40 MG PO TBEC
80.0000 mg | DELAYED_RELEASE_TABLET | Freq: Every day | ORAL | Status: DC
  Administered 2024-04-16: 80 mg via ORAL
  Filled 2024-04-16: qty 2

## 2024-04-16 MED ORDER — LIDOCAINE HCL (PF) 2 % IJ SOLN
INTRAMUSCULAR | Status: AC
  Filled 2024-04-16: qty 5

## 2024-04-16 MED ORDER — CEFAZOLIN SODIUM-DEXTROSE 2-4 GM/100ML-% IV SOLN
2.0000 g | INTRAVENOUS | Status: AC
  Administered 2024-04-16: 2 g via INTRAVENOUS
  Filled 2024-04-16: qty 100

## 2024-04-16 MED ORDER — CLONIDINE HCL (ANALGESIA) 100 MCG/ML EP SOLN
EPIDURAL | Status: DC | PRN
  Administered 2024-04-16: 80 ug

## 2024-04-16 MED ORDER — INSULIN GLARGINE (1 UNIT DIAL) 300 UNIT/ML ~~LOC~~ SOPN: 38.0000 [IU] | PEN_INJECTOR | Freq: Every day | SUBCUTANEOUS | Status: DC

## 2024-04-16 MED ORDER — ONDANSETRON HCL 4 MG/2ML IJ SOLN
INTRAMUSCULAR | Status: AC
  Filled 2024-04-16: qty 2

## 2024-04-16 MED ORDER — ACETAMINOPHEN 500 MG PO TABS
1000.0000 mg | ORAL_TABLET | Freq: Four times a day (QID) | ORAL | Status: AC
  Administered 2024-04-16 – 2024-04-17 (×3): 1000 mg via ORAL
  Filled 2024-04-16 (×3): qty 2

## 2024-04-16 MED ORDER — ALPRAZOLAM 0.25 MG PO TABS
0.2500 mg | ORAL_TABLET | Freq: Every day | ORAL | Status: DC
  Administered 2024-04-16: 0.25 mg via ORAL
  Filled 2024-04-16: qty 1

## 2024-04-16 MED ORDER — PROPOFOL 10 MG/ML IV BOLUS
INTRAVENOUS | Status: AC
  Filled 2024-04-16: qty 20

## 2024-04-16 MED ORDER — SODIUM CHLORIDE (PF) 0.9 % IJ SOLN
INTRAMUSCULAR | Status: AC
  Filled 2024-04-16: qty 10

## 2024-04-16 MED ORDER — PHENYLEPHRINE 80 MCG/ML (10ML) SYRINGE FOR IV PUSH (FOR BLOOD PRESSURE SUPPORT)
PREFILLED_SYRINGE | INTRAVENOUS | Status: AC
  Filled 2024-04-16: qty 10

## 2024-04-16 MED ORDER — DOCUSATE SODIUM 100 MG PO CAPS
100.0000 mg | ORAL_CAPSULE | Freq: Two times a day (BID) | ORAL | Status: DC
Start: 2024-04-16 — End: 2024-04-17
  Administered 2024-04-16 – 2024-04-17 (×2): 100 mg via ORAL
  Filled 2024-04-16 (×2): qty 1

## 2024-04-16 MED ORDER — STERILE WATER FOR IRRIGATION IR SOLN
Status: DC | PRN
  Administered 2024-04-16: 1000 mL

## 2024-04-16 MED ORDER — TRANEXAMIC ACID-NACL 1000-0.7 MG/100ML-% IV SOLN
1000.0000 mg | INTRAVENOUS | Status: AC
  Administered 2024-04-16: 1000 mg via INTRAVENOUS
  Filled 2024-04-16: qty 100

## 2024-04-16 MED ORDER — PHENYLEPHRINE HCL-NACL 20-0.9 MG/250ML-% IV SOLN
INTRAVENOUS | Status: DC | PRN
  Administered 2024-04-16: 40 ug/min via INTRAVENOUS

## 2024-04-16 MED ORDER — PHENOL 1.4 % MT LIQD: 1.0000 | OROMUCOSAL | Status: DC | PRN

## 2024-04-16 MED ORDER — LIDOCAINE HCL (CARDIAC) PF 100 MG/5ML IV SOSY
PREFILLED_SYRINGE | INTRAVENOUS | Status: DC | PRN
  Administered 2024-04-16: 50 mg via INTRAVENOUS

## 2024-04-16 MED ORDER — SODIUM CHLORIDE (PF) 0.9 % IJ SOLN
INTRAMUSCULAR | Status: DC | PRN
  Administered 2024-04-16: 80 mL

## 2024-04-16 MED ORDER — FENTANYL CITRATE (PF) 100 MCG/2ML IJ SOLN
INTRAMUSCULAR | Status: DC | PRN
  Administered 2024-04-16 (×2): 50 ug via INTRAVENOUS

## 2024-04-16 MED ORDER — INSULIN ASPART 100 UNIT/ML IJ SOLN
0.0000 [IU] | Freq: Three times a day (TID) | INTRAMUSCULAR | Status: DC
  Administered 2024-04-16: 2 [IU] via SUBCUTANEOUS
  Administered 2024-04-16: 11 [IU] via SUBCUTANEOUS
  Administered 2024-04-17: 3 [IU] via SUBCUTANEOUS
  Administered 2024-04-17: 5 [IU] via SUBCUTANEOUS

## 2024-04-16 MED ORDER — GABAPENTIN 300 MG PO CAPS
300.0000 mg | ORAL_CAPSULE | Freq: Three times a day (TID) | ORAL | Status: DC
  Administered 2024-04-16 – 2024-04-17 (×4): 300 mg via ORAL
  Filled 2024-04-16 (×4): qty 1

## 2024-04-16 MED ORDER — SODIUM CHLORIDE (PF) 0.9 % IJ SOLN
INTRAMUSCULAR | Status: AC
  Filled 2024-04-16: qty 50

## 2024-04-16 MED ORDER — POVIDONE-IODINE 10 % EX SWAB: 2.0000 | Freq: Once | CUTANEOUS | Status: DC

## 2024-04-16 MED ORDER — MORPHINE SULFATE (PF) 2 MG/ML IV SOLN: 1.0000 mg | INTRAVENOUS | Status: DC | PRN

## 2024-04-16 MED ORDER — METOCLOPRAMIDE HCL 5 MG/ML IJ SOLN: 5.0000 mg | Freq: Three times a day (TID) | INTRAMUSCULAR | Status: DC | PRN

## 2024-04-16 MED ORDER — POLYETHYLENE GLYCOL 3350 17 G PO PACK: 17.0000 g | PACK | Freq: Every day | ORAL | Status: DC | PRN

## 2024-04-16 MED ORDER — ATENOLOL 50 MG PO TABS
50.0000 mg | ORAL_TABLET | Freq: Every day | ORAL | Status: DC
  Administered 2024-04-16: 50 mg via ORAL
  Filled 2024-04-16: qty 1

## 2024-04-16 MED ORDER — ORAL CARE MOUTH RINSE: 15.0000 mL | Freq: Once | OROMUCOSAL | Status: AC

## 2024-04-16 MED ORDER — FLEET ENEMA RE ENEM: 1.0000 | ENEMA | Freq: Once | RECTAL | Status: DC | PRN

## 2024-04-16 MED ORDER — ONDANSETRON HCL 4 MG/2ML IJ SOLN
INTRAMUSCULAR | Status: DC | PRN
  Administered 2024-04-16: 4 mg via INTRAVENOUS

## 2024-04-16 MED ORDER — ACETAMINOPHEN 10 MG/ML IV SOLN
1000.0000 mg | Freq: Four times a day (QID) | INTRAVENOUS | Status: DC
  Administered 2024-04-16: 1000 mg via INTRAVENOUS
  Filled 2024-04-16: qty 100

## 2024-04-16 MED ORDER — FENTANYL CITRATE (PF) 100 MCG/2ML IJ SOLN
INTRAMUSCULAR | Status: AC
  Filled 2024-04-16: qty 2

## 2024-04-16 MED ORDER — DEXAMETHASONE SODIUM PHOSPHATE 10 MG/ML IJ SOLN
8.0000 mg | Freq: Once | INTRAMUSCULAR | Status: AC
  Administered 2024-04-16: 10 mg via INTRAVENOUS

## 2024-04-16 MED ORDER — MENTHOL 3 MG MT LOZG: 1.0000 | LOZENGE | OROMUCOSAL | Status: DC | PRN

## 2024-04-16 MED ORDER — TRAMADOL HCL 50 MG PO TABS
50.0000 mg | ORAL_TABLET | Freq: Four times a day (QID) | ORAL | Status: DC | PRN
  Administered 2024-04-16 (×2): 100 mg via ORAL
  Administered 2024-04-17: 50 mg via ORAL
  Administered 2024-04-17: 100 mg via ORAL
  Filled 2024-04-16 (×3): qty 2
  Filled 2024-04-16: qty 1

## 2024-04-16 MED ORDER — INSULIN ASPART 100 UNIT/ML IJ SOLN
0.0000 [IU] | Freq: Every day | INTRAMUSCULAR | Status: DC
  Administered 2024-04-16: 5 [IU] via SUBCUTANEOUS

## 2024-04-16 MED ORDER — CEFAZOLIN SODIUM-DEXTROSE 2-4 GM/100ML-% IV SOLN
2.0000 g | Freq: Four times a day (QID) | INTRAVENOUS | Status: AC
  Administered 2024-04-16 (×2): 2 g via INTRAVENOUS
  Filled 2024-04-16 (×2): qty 100

## 2024-04-16 MED ORDER — SODIUM CHLORIDE 0.9 % IV SOLN: INTRAVENOUS | Status: DC

## 2024-04-16 MED ORDER — PHENYLEPHRINE 80 MCG/ML (10ML) SYRINGE FOR IV PUSH (FOR BLOOD PRESSURE SUPPORT)
PREFILLED_SYRINGE | INTRAVENOUS | Status: DC | PRN
Start: 2024-04-16 — End: 2024-04-16
  Administered 2024-04-16 (×2): 160 ug via INTRAVENOUS
  Administered 2024-04-16: 240 ug via INTRAVENOUS
  Administered 2024-04-16 (×2): 160 ug via INTRAVENOUS

## 2024-04-16 MED ORDER — DEXAMETHASONE SODIUM PHOSPHATE 4 MG/ML IJ SOLN
INTRAMUSCULAR | Status: DC | PRN
  Administered 2024-04-16: 5 mg via PERINEURAL

## 2024-04-16 MED ORDER — MIDAZOLAM HCL 2 MG/2ML IJ SOLN
INTRAMUSCULAR | Status: DC | PRN
  Administered 2024-04-16: 2 mg via INTRAVENOUS

## 2024-04-16 MED ORDER — CHLORHEXIDINE GLUCONATE 4 % EX SOLN: 1.0000 | CUTANEOUS | 1 refills | Status: AC

## 2024-04-16 MED ORDER — MIDAZOLAM HCL 2 MG/2ML IJ SOLN
INTRAMUSCULAR | Status: AC
  Filled 2024-04-16: qty 2

## 2024-04-16 MED ORDER — OXYCODONE HCL 5 MG PO TABS
5.0000 mg | ORAL_TABLET | ORAL | Status: DC | PRN
  Administered 2024-04-17: 10 mg via ORAL
  Filled 2024-04-16: qty 2

## 2024-04-16 MED ORDER — LACTATED RINGERS IV SOLN: INTRAVENOUS | Status: DC

## 2024-04-16 MED ORDER — ONDANSETRON HCL 4 MG PO TABS: 4.0000 mg | ORAL_TABLET | Freq: Four times a day (QID) | ORAL | Status: DC | PRN

## 2024-04-16 MED ORDER — ORAL CARE MOUTH RINSE: 15.0000 mL | OROMUCOSAL | Status: DC | PRN

## 2024-04-16 MED ORDER — DIPHENHYDRAMINE HCL 12.5 MG/5ML PO ELIX: 12.5000 mg | ORAL_SOLUTION | ORAL | Status: DC | PRN

## 2024-04-16 MED ORDER — BUPIVACAINE LIPOSOME 1.3 % IJ SUSP
INTRAMUSCULAR | Status: AC
  Filled 2024-04-16: qty 20

## 2024-04-16 MED ORDER — PROPOFOL 10 MG/ML IV BOLUS
INTRAVENOUS | Status: DC | PRN
  Administered 2024-04-16: 30 mg via INTRAVENOUS
  Administered 2024-04-16: 20 mg via INTRAVENOUS
  Administered 2024-04-16: 75 ug/kg/min via INTRAVENOUS

## 2024-04-16 MED ORDER — ONDANSETRON HCL 4 MG/2ML IJ SOLN: 4.0000 mg | Freq: Four times a day (QID) | INTRAMUSCULAR | Status: DC | PRN

## 2024-04-16 MED ORDER — LEVOCETIRIZINE DIHYDROCHLORIDE 5 MG PO TABS: 5.0000 mg | ORAL_TABLET | Freq: Every evening | ORAL | Status: DC

## 2024-04-16 MED ORDER — METHOCARBAMOL 1000 MG/10ML IJ SOLN: 500.0000 mg | Freq: Four times a day (QID) | INTRAMUSCULAR | Status: DC | PRN

## 2024-04-16 MED ORDER — BUPIVACAINE LIPOSOME 1.3 % IJ SUSP: 20.0000 mL | Freq: Once | INTRAMUSCULAR | Status: AC

## 2024-04-16 SURGICAL SUPPLY — 45 items
ATTUNE MED DOME PAT 38 KNEE (Knees) IMPLANT
ATTUNE PSFEM RTSZ5 NARCEM KNEE (Femur) IMPLANT
ATTUNE PSRP INSE SZ5 7 KNEE (Insert) IMPLANT
BAG COUNTER SPONGE SURGICOUNT (BAG) IMPLANT
BAG ZIPLOCK 12X15 (MISCELLANEOUS) ×1 IMPLANT
BASE TIBIAL ROT PLAT SZ 5 KNEE (Knees) IMPLANT
BLADE SAG 18X100X1.27 (BLADE) ×1 IMPLANT
BLADE SAW SGTL 11.0X1.19X90.0M (BLADE) ×1 IMPLANT
BNDG ELASTIC 4X5.8 VLCR NS LF (GAUZE/BANDAGES/DRESSINGS) IMPLANT
BNDG ELASTIC 6INX 5YD STR LF (GAUZE/BANDAGES/DRESSINGS) ×1 IMPLANT
BOWL SMART MIX CTS (DISPOSABLE) ×1 IMPLANT
CEMENT HV SMART SET (Cement) ×2 IMPLANT
COVER SURGICAL LIGHT HANDLE (MISCELLANEOUS) ×1 IMPLANT
CUFF TRNQT CYL 34X4.125X (TOURNIQUET CUFF) ×1 IMPLANT
DERMABOND ADVANCED .7 DNX12 (GAUZE/BANDAGES/DRESSINGS) ×1 IMPLANT
DRAPE U-SHAPE 47X51 STRL (DRAPES) ×1 IMPLANT
DRESSING AQUACEL AG SP 3.5X10 (GAUZE/BANDAGES/DRESSINGS) IMPLANT
DRSG AQUACEL AG ADV 3.5X10 (GAUZE/BANDAGES/DRESSINGS) ×1 IMPLANT
DURAPREP 26ML APPLICATOR (WOUND CARE) ×1 IMPLANT
ELECT REM PT RETURN 15FT ADLT (MISCELLANEOUS) ×1 IMPLANT
GLOVE BIO SURGEON STRL SZ 6.5 (GLOVE) IMPLANT
GLOVE BIO SURGEON STRL SZ7 (GLOVE) IMPLANT
GLOVE BIO SURGEON STRL SZ8 (GLOVE) ×1 IMPLANT
GLOVE BIOGEL PI IND STRL 7.0 (GLOVE) ×1 IMPLANT
GLOVE BIOGEL PI IND STRL 8 (GLOVE) ×1 IMPLANT
GOWN STRL REUS W/ TWL LRG LVL3 (GOWN DISPOSABLE) ×1 IMPLANT
HOLDER FOLEY CATH W/STRAP (MISCELLANEOUS) ×1 IMPLANT
IMMOBILIZER KNEE 20 THIGH 36 (SOFTGOODS) ×1 IMPLANT
KIT TURNOVER KIT A (KITS) ×1 IMPLANT
MANIFOLD NEPTUNE II (INSTRUMENTS) ×1 IMPLANT
NS IRRIG 1000ML POUR BTL (IV SOLUTION) ×1 IMPLANT
PACK TOTAL KNEE CUSTOM (KITS) ×1 IMPLANT
PADDING CAST COTTON 6X4 STRL (CAST SUPPLIES) ×2 IMPLANT
PENCIL SMOKE EVACUATOR (MISCELLANEOUS) ×1 IMPLANT
PIN STEINMAN FIXATION KNEE (PIN) IMPLANT
PROTECTOR NERVE ULNAR (MISCELLANEOUS) ×1 IMPLANT
SET HNDPC FAN SPRY TIP SCT (DISPOSABLE) ×1 IMPLANT
SUT MNCRL AB 4-0 PS2 18 (SUTURE) ×1 IMPLANT
SUT VIC AB 2-0 CT1 TAPERPNT 27 (SUTURE) ×3 IMPLANT
SUTURE STRATFX 0 PDS 27 VIOLET (SUTURE) ×1 IMPLANT
TOWEL GREEN STERILE FF (TOWEL DISPOSABLE) ×1 IMPLANT
TRAY FOLEY MTR SLVR 16FR STAT (SET/KITS/TRAYS/PACK) ×1 IMPLANT
TUBE SUCTION HIGH CAP CLEAR NV (SUCTIONS) ×1 IMPLANT
WATER STERILE IRR 1000ML POUR (IV SOLUTION) ×2 IMPLANT
WRAP KNEE MAXI GEL POST OP (GAUZE/BANDAGES/DRESSINGS) ×1 IMPLANT

## 2024-04-16 NOTE — Discharge Instructions (Signed)
 Ollen Gross, MD Total Joint Specialist [facility] [address], [address] [place], [place] [address] [phone]  TOTAL KNEE REPLACEMENT POSTOPERATIVE DIRECTIONS    Knee Rehabilitation, Guidelines Following Surgery  Results after knee surgery are often greatly improved when you follow the exercise, range of motion and muscle strengthening exercises prescribed by your doctor. Safety measures are also important to protect the knee from further injury. If any of these exercises cause you to have increased pain or swelling in your knee joint, decrease the amount until you are comfortable again and slowly increase them. If you have problems or questions, call your caregiver or physical therapist for advice.   BLOOD CLOT PREVENTION Take an 81 mg Aspirin two times a day for three weeks following surgery. Then take an 81 mg Aspirin once a day for three weeks. Then discontinue Aspirin. You may resume your vitamins/supplements upon discharge from the hospital. Do not take any NSAIDs (Advil, Aleve, Ibuprofen, Meloxicam, etc.) until you have discontinued the 81 mg Aspirin twice a day.  GABAPENTIN INSTRUCTIONS Take a 300 mg capsule three times a day for two weeks following surgery.Then take a 300 mg capsule two times a day for two weeks. Then take a 300 mg capsule once a day for two weeks. Then discontinue.  HOME CARE INSTRUCTIONS  Remove items at home which could result in a fall. This includes throw rugs or furniture in walking pathways.  ICE to the affected knee as much as tolerated. Icing helps control swelling. If the swelling is well controlled you will be more comfortable and rehab easier. Continue to use ice on the knee for pain and swelling from surgery. You may notice swelling that will progress down to the foot and ankle. This is normal after surgery. Elevate the leg when you are not up walking on it.    Continue to use the breathing machine which will help keep your  temperature down. It is common for your temperature to cycle up and down following surgery, especially at night when you are not up moving around and exerting yourself. The breathing machine keeps your lungs expanded and your temperature down. Do not place pillow under the operative knee, focus on keeping the knee straight while resting  DIET You may resume your previous home diet once you are discharged from the hospital.  DRESSING / WOUND CARE / SHOWERING Keep your bulky bandage on for 2 days. On the third post-operative day you may remove the Ace bandage and gauze. There is a waterproof adhesive bandage on your skin which will stay in place until your first follow-up appointment. Once you remove this you will not need to place another bandage You may begin showering 3 days following surgery, but do not submerge the incision under water.  ACTIVITY For the first 5 days, the key is rest and control of pain and swelling Do your home exercises twice a day starting on post-operative day 3. On the days you go to physical therapy, just do the home exercises once that day. You should rest, ice and elevate the leg for 50 minutes out of every hour. Get up and walk/stretch for 10 minutes per hour. After 5 days you can increase your activity slowly as tolerated. Walk with your walker as instructed. Use the walker until you are comfortable transitioning to a cane. Walk with the cane in the opposite hand of the operative leg. You may discontinue the cane once you are comfortable and walking steadily. Avoid periods  of inactivity such as sitting longer than an hour when not asleep. This helps prevent blood clots.  You may discontinue the knee immobilizer once you are able to perform a straight leg raise while lying down. You may resume a sexual relationship in one month or when given the OK by your doctor.  You may return to work once you are cleared by your doctor.  Do not drive a car for 6 weeks or until  released by your surgeon.  Do not drive while taking narcotics.  TED HOSE STOCKINGS Wear the elastic stockings on both legs for three weeks following surgery during the day. You may remove them at night for sleeping.  WEIGHT BEARING Weight bearing as tolerated with assist device (walker, cane, etc) as directed, use it as long as suggested by your surgeon or therapist, typically at least 4-6 weeks.  POSTOPERATIVE CONSTIPATION PROTOCOL Constipation - defined medically as fewer than three stools per week and severe constipation as less than one stool per week.  One of the most common issues patients have following surgery is constipation.  Even if you have a regular bowel pattern at home, your normal regimen is likely to be disrupted due to multiple reasons following surgery.  Combination of anesthesia, postoperative narcotics, change in appetite and fluid intake all can affect your bowels.  In order to avoid complications following surgery, here are some recommendations in order to help you during your recovery period.  Colace (docusate) - Pick up an over-the-counter form of Colace or another stool softener and take twice a day as long as you are requiring postoperative pain medications.  Take with a full glass of water daily.  If you experience loose stools or diarrhea, hold the colace until you stool forms back up. If your symptoms do not get better within 1 week or if they get worse, check with your doctor. Dulcolax (bisacodyl) - Pick up over-the-counter and take as directed by the product packaging as needed to assist with the movement of your bowels.  Take with a full glass of water.  Use this product as needed if not relieved by Colace only.  MiraLax (polyethylene glycol) - Pick up over-the-counter to have on hand. MiraLax is a solution that will increase the amount of water in your bowels to assist with bowel movements.  Take as directed and can mix with a glass of water, juice, soda, coffee, or  tea. Take if you go more than two days without a movement. Do not use MiraLax more than once per day. Call your doctor if you are still constipated or irregular after using this medication for 7 days in a row.  If you continue to have problems with postoperative constipation, please contact the office for further assistance and recommendations.  If you experience "the worst abdominal pain ever" or develop nausea or vomiting, please contact the office immediatly for further recommendations for treatment.  ITCHING If you experience itching with your medications, try taking only a single pain pill, or even half a pain pill at a time.  You can also use Benadryl over the counter for itching or also to help with sleep.   MEDICATIONS See your medication summary on the "After Visit Summary" that the nursing staff will review with you prior to discharge.  You may have some home medications which will be placed on hold until you complete the course of blood thinner medication.  It is important for you to complete the blood thinner medication as prescribed  by your surgeon.  Continue your approved medications as instructed at time of discharge.  PRECAUTIONS If you experience chest pain or shortness of breath - call 911 immediately for transfer to the hospital emergency department.  If you develop a fever greater that 101 F, purulent drainage from wound, increased redness or drainage from wound, foul odor from the wound/dressing, or calf pain - CONTACT YOUR SURGEON.                                                   FOLLOW-UP APPOINTMENTS Make sure you keep all of your appointments after your operation with your surgeon and caregivers. You should call the office at the above phone number and make an appointment for approximately two weeks after the date of your surgery or on the date instructed by your surgeon outlined in the "After Visit Summary".  RANGE OF MOTION AND STRENGTHENING EXERCISES  Rehabilitation of  the knee is important following a knee injury or an operation. After just a few days of immobilization, the muscles of the thigh which control the knee become weakened and shrink (atrophy). Knee exercises are designed to build up the tone and strength of the thigh muscles and to improve knee motion. Often times heat used for twenty to thirty minutes before working out will loosen up your tissues and help with improving the range of motion but do not use heat for the first two weeks following surgery. These exercises can be done on a training (exercise) mat, on the floor, on a table or on a bed. Use what ever works the best and is most comfortable for you Knee exercises include:  Leg Lifts - While your knee is still immobilized in a splint or cast, you can do straight leg raises. Lift the leg to 60 degrees, hold for 3 sec, and slowly lower the leg. Repeat 10-20 times 2-3 times daily. Perform this exercise against resistance later as your knee gets better.  Quad and Hamstring Sets - Tighten up the muscle on the front of the thigh (Quad) and hold for 5-10 sec. Repeat this 10-20 times hourly. Hamstring sets are done by pushing the foot backward against an object and holding for 5-10 sec. Repeat as with quad sets.  Leg Slides: Lying on your back, slowly slide your foot toward your buttocks, bending your knee up off the floor (only go as far as is comfortable). Then slowly slide your foot back down until your leg is flat on the floor again. Angel Wings: Lying on your back spread your legs to the side as far apart as you can without causing discomfort.  A rehabilitation program following serious knee injuries can speed recovery and prevent re-injury in the future due to weakened muscles. Contact your doctor or a physical therapist for more information on knee rehabilitation.   POST-OPERATIVE OPIOID TAPER INSTRUCTIONS: It is important to wean off of your opioid medication as soon as possible. If you do not need pain  medication after your surgery it is ok to stop day one. Opioids include: Codeine, Hydrocodone(Norco, Vicodin), Oxycodone(Percocet, oxycontin) and hydromorphone amongst others.  Long term and even short term use of opiods can cause: Increased pain response Dependence Constipation Depression Respiratory depression And more.  Withdrawal symptoms can include Flu like symptoms Nausea, vomiting And more Techniques to manage these symptoms Hydrate well  Eat regular healthy meals Stay active Use relaxation techniques(deep breathing, meditating, yoga) Do Not substitute Alcohol to help with tapering If you have been on opioids for less than two weeks and do not have pain than it is ok to stop all together.  Plan to wean off of opioids This plan should start within one week post op of your joint replacement. Maintain the same interval or time between taking each dose and first decrease the dose.  Cut the total daily intake of opioids by one tablet each day Next start to increase the time between doses. The last dose that should be eliminated is the evening dose.   IF YOU ARE TRANSFERRED TO A SKILLED REHAB FACILITY If the patient is transferred to a skilled rehab facility following release from the hospital, a list of the current medications will be sent to the facility for the patient to continue.  When discharged from the skilled rehab facility, please have the facility set up the patient's Home Health Physical Therapy prior to being released. Also, the skilled facility will be responsible for providing the patient with their medications at time of release from the facility to include their pain medication, the muscle relaxants, and their blood thinner medication. If the patient is still at the rehab facility at time of the two week follow up appointment, the skilled rehab facility will also need to assist the patient in arranging follow up appointment in our office and any transportation  needs.  MAKE SURE YOU:  Understand these instructions.  Get help right away if you are not doing well or get worse.   DENTAL ANTIBIOTICS:  In most cases prophylactic antibiotics for Dental procdeures after total joint surgery are not necessary.  Exceptions are as follows:  1. History of prior total joint infection  2. Severely immunocompromised (Organ Transplant, cancer chemotherapy, Rheumatoid biologic medications such as Humera)  3. Poorly controlled diabetes (A1C &gt; 8.0, blood glucose over 200)  If you have one of these conditions, contact your surgeon for an antibiotic prescription, prior to your dental procedure.    Pick up stool softner and laxative for home use following surgery while on pain medications. Do not submerge incision under water. Please use good hand washing techniques while changing dressing each day. May shower starting three days after surgery. Please use a clean towel to pat the incision dry following showers. Continue to use ice for pain and swelling after surgery. Do not use any lotions or creams on the incision until instructed by your surgeon.

## 2024-04-16 NOTE — Evaluation (Addendum)
 Physical Therapy Evaluation Patient Details Name: Shelley Dixon MRN: [id number] DOB: Sep 19, 1961 Today's Date: 04/16/2024  History of Present Illness  Pt is a 63  yo female s/p R TKA on 04/16/24. 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  Unable to amb d/t decr R quad activation/slow regression of anesthesia. R knee buckling (with KI in place) in standing x2, assisted with lateral scooting transfer bed to chair for pt safety. NT made aware.  Pt will benefit from acute skilled PT to increase their independence and safety with mobility to allow discharge.          If plan is discharge home, recommend the following: Help with stairs or ramp for entrance;Assistance with cooking/housework;Assist for transportation   Can travel by private vehicle        Equipment Recommendations None recommended by PT  Recommendations for Other Services       Functional Status Assessment Patient has had a recent decline in their functional status and demonstrates the ability to make significant improvements in function in a reasonable and predictable amount of time.     Precautions / Restrictions Precautions Precautions: Fall;Knee Required Braces or Orthoses: Knee Immobilizer - Right Restrictions Weight Bearing Restrictions Per Provider Order: No Other Position/Activity Restrictions: WBAT      Mobility  Bed Mobility Overal bed mobility: Needs Assistance Bed Mobility: Supine to Sit     Supine to sit: Supervision     General bed mobility comments: for safety    Transfers Overall transfer level: Needs assistance Equipment used: Rolling walker (2 wheels) Transfers: Sit to/from Stand, Bed to chair/wheelchair/BSC Sit to Stand: Min assist          Lateral/Scoot Transfers: Contact guard assist General transfer comment: light assist to come to stand, cues for hand placement and RLE position;  lateral scooting transfer d/t decr quad activation RLE    Ambulation/Gait               General Gait Details: unable d/t R knee buckling with wt shift to R with KI in place  Stairs            Wheelchair Mobility     Tilt Bed    Modified Rankin (Stroke Patients Only)       Balance Overall balance assessment: Needs assistance Sitting-balance support: Feet supported, No upper extremity supported Sitting balance-Leahy Scale: Good     Standing balance support: Reliant on assistive device for balance, During functional activity Standing balance-Leahy Scale: Poor                               Pertinent Vitals/Pain Pain Assessment Pain Assessment: 0-10 Pain Score: 3  Pain Location: right knee Pain Descriptors / Indicators: Aching, Sore Pain Intervention(s): Limited activity within patient's tolerance, Monitored during session, Premedicated before session    Home Living Family/patient expects to be discharged to:: Private residence Living Arrangements: Spouse/significant other;Children Available Help at Discharge: Family;Available 24 hours/day   Home Access: Stairs to enter Entrance Stairs-Rails: None Entrance Stairs-Number of Steps: 2 Alternate Level Stairs-Number of Steps: 12 Home Layout: Two level Home Equipment: Agricultural consultant (2 wheels);Cane - single point      Prior Function Prior Level of Function : Independent/Modified Independent             Mobility Comments: ind ADLs Comments: ind  Extremity/Trunk Assessment   Upper Extremity Assessment Upper Extremity Assessment: Overall WFL for tasks assessed    Lower Extremity Assessment Lower Extremity Assessment: RLE deficits/detail RLE Deficits / Details: ankle WFL, SLR with 10 degree quad lag       Communication   Communication Communication: No apparent difficulties    Cognition Arousal: Alert Behavior During Therapy: WFL for tasks assessed/performed   PT - Cognitive  impairments: No apparent impairments                         Following commands: Intact       Cueing Cueing Techniques: Verbal cues     General Comments      Exercises Total Joint Exercises Ankle Circles/Pumps: AROM, Both, 5 reps Quad Sets: Both, 5 reps, Right   Assessment/Plan    PT Assessment Patient needs continued PT services  PT Problem List Decreased strength;Decreased range of motion;Decreased activity tolerance;Decreased balance;Decreased knowledge of use of DME;Decreased mobility       PT Treatment Interventions DME instruction;Gait training;Functional mobility training;Therapeutic activities;Therapeutic exercise;Patient/family education    PT Goals (Current goals can be found in the Care Plan section)  Acute Rehab PT Goals PT Goal Formulation: With patient Time For Goal Achievement: 04/23/24 Potential to Achieve Goals: Good    Frequency 7X/week     Co-evaluation               AM-PAC PT 6 Clicks Mobility  Outcome Measure Help needed turning from your back to your side while in a flat bed without using bedrails?: A Little Help needed moving from lying on your back to sitting on the side of a flat bed without using bedrails?: A Little Help needed moving to and from a bed to a chair (including a wheelchair)?: A Little Help needed standing up from a chair using your arms (e.g., wheelchair or bedside chair)?: A Lot Help needed to walk in hospital room?: Total Help needed climbing 3-5 steps with a railing? : Total 6 Click Score: 13    End of Session Equipment Utilized During Treatment: Gait belt;Right knee immobilizer Activity Tolerance: Other (comment);Patient tolerated treatment well (ltd by residual effects of anesthesia) Patient left: in chair;with chair alarm set;with call bell/phone within reach   PT Visit Diagnosis: Other abnormalities of gait and mobility (R26.89);Difficulty in walking, not elsewhere classified (R26.2)    Time:  8591-8569 PT Time Calculation (min) (ACUTE ONLY): 22 min   Charges:   PT Evaluation $PT Eval Low Complexity: 1 Low   PT General Charges $$ ACUTE PT VISIT: 1 Visit         Symir Mah, PT  Acute Rehab Dept [facility]) [phone]  04/16/2024   [facility] 04/16/2024, 2:42 PM

## 2024-04-16 NOTE — Progress Notes (Signed)
 Orthopedic Tech Progress Note Patient Details:  Shelley Dixon 1961-03-27 [id number]  CPM Right Knee CPM Right Knee: On Right Knee Flexion (Degrees): 40 Right Knee Extension (Degrees): 10  Post Interventions Patient Tolerated: Well Instructions Provided: Care of device  [name] 04/16/2024, 9:04 AM

## 2024-04-16 NOTE — Anesthesia Procedure Notes (Signed)
 Procedure Name: MAC Date/Time: 04/16/2024 7:19 AM  Performed by: Hanley Delon POUR, CRNAPre-anesthesia Checklist: Patient identified, Emergency Drugs available, Suction available and Patient being monitored Patient Re-evaluated:Patient Re-evaluated prior to induction Oxygen Delivery Method: Simple face mask

## 2024-04-16 NOTE — Interval H&P Note (Signed)
 History and Physical Interval Note:  04/16/2024 6:48 AM  Verneita VEAR Fanny  has presented today for surgery, with the diagnosis of Right knee osteoarthritis.  The various methods of treatment have been discussed with the patient and family. After consideration of risks, benefits and other options for treatment, the patient has consented to  Procedure(s): ARTHROPLASTY, KNEE, TOTAL (Right) as a surgical intervention.  The patient's history has been reviewed, patient examined, no change in status, stable for surgery.  I have reviewed the patient's chart and labs.  Questions were answered to the patient's satisfaction.     Dempsey Kate Sweetman

## 2024-04-16 NOTE — Transfer of Care (Signed)
 Immediate Anesthesia Transfer of Care Note  Patient: Shelley Dixon  Procedure(s) Performed: ARTHROPLASTY, KNEE, TOTAL (Right: Knee)  Patient Location: PACU  Anesthesia Type:MAC and Spinal  Level of Consciousness: sedated and patient cooperative  Airway & Oxygen Therapy: Patient Spontanous Breathing and Patient connected to face mask oxygen  Post-op Assessment: Report given to RN and Post -op Vital signs reviewed and stable  Post vital signs: Reviewed  Last Vitals:  Vitals Value Taken Time  BP 109/56 04/16/24 08:53  Temp    Pulse 75 04/16/24 08:56  Resp 15 04/16/24 08:56  SpO2 100 % 04/16/24 08:56  Vitals shown include unfiled device data.  Last Pain:  Vitals:   04/16/24 0548  TempSrc: Oral  PainSc: 0-No pain         Complications: No notable events documented.

## 2024-04-16 NOTE — Progress Notes (Signed)
 Orthopedic Tech Progress Note Patient Details:  Shelley Dixon [birth year] [id number]  CPM Right Knee CPM Right Knee: Off Right Knee Flexion (Degrees): 40 Right Knee Extension (Degrees): 10  Post Interventions Patient Tolerated: Well Instructions Provided: Care of device  [name] 04/16/2024, 1:05 PM

## 2024-04-16 NOTE — Anesthesia Procedure Notes (Signed)
 Anesthesia Regional Block: Adductor canal block   Pre-Anesthetic Checklist: , timeout performed,  Correct Patient, Correct Site, Correct Laterality,  Correct Procedure, Correct Position, site marked,  Risks and benefits discussed,  Surgical consent,  Pre-op evaluation,  At surgeon's request and post-op pain management  Laterality: Lower and Right  Prep: chloraprep       Needles:  Injection technique: Single-shot  Needle Type: Stimiplex     Needle Length: 9cm  Needle Gauge: 21     Additional Needles:   Procedures:,,,, ultrasound used (permanent image in chart),,    Narrative:  Start time: 04/16/2024 6:45 AM End time: 04/16/2024 7:05 AM Injection made incrementally with aspirations every 5 mL.  Performed by: Personally  Anesthesiologist: Darlyn Rush, MD  Additional Notes: BP cuff, EKG monitors applied. Sedation begun. Artery and nerve location verified with ultrasound. Anesthetic injected incrementally (5ml), slowly, and after negative aspirations under direct u/s guidance. Good fascial/perineural spread. Tolerated well.

## 2024-04-16 NOTE — Op Note (Signed)
 OPERATIVE REPORT-TOTAL KNEE ARTHROPLASTY   Pre-operative diagnosis- Osteoarthritis  Right knee(s)  Post-operative diagnosis- Osteoarthritis Right knee(s)  Procedure-  Right  Total Knee Arthroplasty  Surgeon- Shelley GAILS. Courtne Lighty, MD  Assistant- Corean Sender, PA-C   Anesthesia-  Adductor canal block and spinal  EBL- 25 ml   Drains None  Tourniquet time-  Total Tourniquet Time Documented: Thigh (Right) - 34 minutes Total: Thigh (Right) - 34 minutes     Complications- None  Condition-PACU - hemodynamically stable.   Brief Clinical Note  Shelley Dixon is a 63 y.o. year old female with end stage OA of her right knee with progressively worsening pain and dysfunction. She has constant pain, with activity and at rest and significant functional deficits with difficulties even with ADLs. She has had extensive non-op management including analgesics, injections of cortisone and viscosupplements, and home exercise program, but remains in significant pain with significant dysfunction.Radiographs show bone on bone arthritis medial and patellofemoal. She presents now for right Total Knee Arthroplasty.     Procedure in detail---   The patient is brought into the operating room and positioned supine on the operating table. After successful administration of  Adductor canal block and spinal,   a tourniquet is placed high on the  Right thigh(s) and the lower extremity is prepped and draped in the usual sterile fashion. Time out is performed by the operating team and then the  Right lower extremity is wrapped in Esmarch, knee flexed and the tourniquet inflated to 300 mmHg.       A midline incision is made with a ten blade through the subcutaneous tissue to the level of the extensor mechanism. A fresh blade is used to make a medial parapatellar arthrotomy. Soft tissue over the proximal medial tibia is subperiosteally elevated to the joint line with a knife and into the semimembranosus bursa  with a Cobb elevator. Soft tissue over the proximal lateral tibia is elevated with attention being paid to avoiding the patellar tendon on the tibial tubercle. The patella is everted, knee flexed 90 degrees and the ACL and PCL are removed. Findings are bone on bone medial and patellofemoral with large global osteophytes.        The drill is used to create a starting hole in the distal femur and the canal is thoroughly irrigated with sterile saline to remove the fatty contents. The 5 degree Right  valgus alignment guide is placed into the femoral canal and the distal femoral cutting block is pinned to remove 10 mm off the distal femur. Resection is made with an oscillating saw.      The tibia is subluxed forward and the menisci are removed. The extramedullary alignment guide is placed referencing proximally at the medial aspect of the tibial tubercle and distally along the second metatarsal axis and tibial crest. The block is pinned to remove 2mm off the more deficient medial  side. Resection is made with an oscillating saw. Size 5is the most appropriate size for the tibia and the proximal tibia is prepared with the modular drill and keel punch for that size.      The femoral sizing guide is placed and size 5 is most appropriate. Rotation is marked off the epicondylar axis and confirmed by creating a rectangular flexion gap at 90 degrees. The size 5 cutting block is pinned in this rotation and the anterior, posterior and chamfer cuts are made with the oscillating saw. The intercondylar block is then placed and that cut is made.  Trial size 5 tibial component, trial size 5 narrow posterior stabilized femur and a 7  mm posterior stabilized rotating platform insert trial is placed. Full extension is achieved with excellent varus/valgus and anterior/posterior balance throughout full range of motion. The patella is everted and thickness measured to be 22  mm. Free hand resection is taken to 12 mm, a 38 template is  placed, lug holes are drilled, trial patella is placed, and it tracks normally. Osteophytes are removed off the posterior femur with the trial in place. All trials are removed and the cut bone surfaces prepared with pulsatile lavage. Cement is mixed and once ready for implantation, the size 5 tibial implant, size  5 narrow posterior stabilized femoral component, and the size 38 patella are cemented in place and the patella is held with the clamp. The trial insert is placed and the knee held in full extension. The Exparel  (20 ml mixed with 60 ml saline) is injected into the extensor mechanism, posterior capsule, medial and lateral gutters and subcutaneous tissues.  All extruded cement is removed and once the cement is hard the permanent 7 mm posterior stabilized rotating platform insert is placed into the tibial tray.      The wound is copiously irrigated with saline solution and the extensor mechanism closed with # 0 Stratofix suture. The tourniquet is released for a total tourniquet time of 34  minutes. Flexion against gravity is 140 degrees and the patella tracks normally. Subcutaneous tissue is closed with 2.0 vicryl and subcuticular with running 4.0 Monocryl. The incision is cleaned and dried and steri-strips and a bulky sterile dressing are applied. The limb is placed into a knee immobilizer and the patient is awakened and transported to recovery in stable condition.      Please note that a surgical assistant was a medical necessity for this procedure in order to perform it in a safe and expeditious manner. Surgical assistant was necessary to retract the ligaments and vital neurovascular structures to prevent injury to them and also necessary for proper positioning of the limb to allow for anatomic placement of the prosthesis.   Shelley ROCKFORD Wakeelah Solan, MD    04/16/2024, 8:29 AM

## 2024-04-16 NOTE — Anesthesia Procedure Notes (Addendum)
 Spinal  Patient location during procedure: OR Start time: 04/16/2024 7:19 AM End time: 04/16/2024 7:24 AM Reason for block: surgical anesthesia Staffing Performed: anesthesiologist  Anesthesiologist: Darlyn Rush, MD Performed by: Darlyn Rush, MD Authorized by: Darlyn Rush, MD   Preanesthetic Checklist Completed: patient identified, IV checked, site marked, risks and benefits discussed, surgical consent, monitors and equipment checked, pre-op evaluation and timeout performed Spinal Block Patient position: sitting Prep: DuraPrep and site prepped and draped Patient monitoring: heart rate, continuous pulse ox and blood pressure Location: L3-4 Injection technique: single-shot Needle Needle type: Spinocan  Needle gauge: 25 G Needle length: 9 cm Additional Notes Expiration date of kit checked and confirmed. Patient tolerated procedure well, without complications.

## 2024-04-16 NOTE — Anesthesia Postprocedure Evaluation (Signed)
 Anesthesia Post Note  Patient: Shelley Dixon  Procedure(s) Performed: ARTHROPLASTY, KNEE, TOTAL (Right: Knee)     Patient location during evaluation: PACU Anesthesia Type: Spinal Level of consciousness: oriented and awake and alert Pain management: pain level controlled Vital Signs Assessment: post-procedure vital signs reviewed and stable Respiratory status: spontaneous breathing, respiratory function stable and patient connected to nasal cannula oxygen Cardiovascular status: blood pressure returned to baseline and stable Postop Assessment: no headache, no backache and no apparent nausea or vomiting Anesthetic complications: no   No notable events documented.  Last Vitals:  Vitals:   04/16/24 1045 04/16/24 1100  BP: 107/63 (!) 97/59  Pulse: 78 77  Resp: 14 15  Temp:  36.6 C  SpO2: 93% 99%    Last Pain:  Vitals:   04/16/24 1235  TempSrc:   PainSc: 5                  Norleen Pope

## 2024-04-17 ENCOUNTER — Encounter (HOSPITAL_COMMUNITY): Payer: Self-pay | Admitting: Orthopedic Surgery

## 2024-04-17 DIAGNOSIS — M1711 Unilateral primary osteoarthritis, right knee: Secondary | ICD-10-CM | POA: Diagnosis not present

## 2024-04-17 LAB — CBC
HCT: 31.4 % — ABNORMAL LOW (ref 36.0–46.0)
Hemoglobin: 10.6 g/dL — ABNORMAL LOW (ref 12.0–15.0)
MCH: 30 pg (ref 26.0–34.0)
MCHC: 33.8 g/dL (ref 30.0–36.0)
MCV: 89 fL (ref 80.0–100.0)
Platelets: 160 K/uL (ref 150–400)
RBC: 3.53 MIL/uL — ABNORMAL LOW (ref 3.87–5.11)
RDW: 12.6 % (ref 11.5–15.5)
WBC: 12.6 K/uL — ABNORMAL HIGH (ref 4.0–10.5)
nRBC: 0 % (ref 0.0–0.2)

## 2024-04-17 LAB — GLUCOSE, CAPILLARY
Glucose-Capillary: 190 mg/dL — ABNORMAL HIGH (ref 70–99)
Glucose-Capillary: 219 mg/dL — ABNORMAL HIGH (ref 70–99)

## 2024-04-17 LAB — BASIC METABOLIC PANEL WITH GFR
Anion gap: 9 (ref 5–15)
BUN: 25 mg/dL — ABNORMAL HIGH (ref 8–23)
CO2: 23 mmol/L (ref 22–32)
Calcium: 9.6 mg/dL (ref 8.9–10.3)
Chloride: 100 mmol/L (ref 98–111)
Creatinine, Ser: 1.25 mg/dL — ABNORMAL HIGH (ref 0.44–1.00)
GFR, Estimated: 49 mL/min — ABNORMAL LOW (ref >60)
Glucose, Bld: 232 mg/dL — ABNORMAL HIGH (ref 70–99)
Potassium: 3.9 mmol/L (ref 3.5–5.1)
Sodium: 132 mmol/L — ABNORMAL LOW (ref 135–145)

## 2024-04-17 MED ORDER — TRAMADOL HCL 50 MG PO TABS: 50.0000 mg | ORAL_TABLET | Freq: Four times a day (QID) | ORAL | 0 refills | Status: AC | PRN

## 2024-04-17 MED ORDER — OXYCODONE HCL 5 MG PO TABS: 5.0000 mg | ORAL_TABLET | Freq: Four times a day (QID) | ORAL | 0 refills | Status: AC | PRN

## 2024-04-17 MED ORDER — ASPIRIN 81 MG PO CHEW: 81.0000 mg | CHEWABLE_TABLET | Freq: Two times a day (BID) | ORAL | 0 refills | Status: AC

## 2024-04-17 MED ORDER — GABAPENTIN 300 MG PO CAPS: 300.0000 mg | ORAL_CAPSULE | ORAL | 0 refills | Status: AC

## 2024-04-17 MED ORDER — ONDANSETRON HCL 4 MG PO TABS: 4.0000 mg | ORAL_TABLET | Freq: Four times a day (QID) | ORAL | 0 refills | Status: AC | PRN

## 2024-04-17 MED ORDER — SODIUM CHLORIDE 0.9 % IV BOLUS
250.0000 mL | Freq: Once | INTRAVENOUS | Status: AC
  Administered 2024-04-17: 250 mL via INTRAVENOUS

## 2024-04-17 MED ORDER — METHOCARBAMOL 500 MG PO TABS: 500.0000 mg | ORAL_TABLET | Freq: Four times a day (QID) | ORAL | 0 refills | Status: AC | PRN

## 2024-04-17 NOTE — Progress Notes (Signed)
 Patient has been given her AVS, and taken to vehicle.

## 2024-04-17 NOTE — Progress Notes (Signed)
 Physical Therapy Treatment Patient Details Name: Shelley Dixon MRN: [id number] DOB: [birth year] Today's Date: 04/17/2024   History of Present Illness Pt is a 63  yo female s/p R TKA on 04/16/24. PMH: DM, GERD, HTN, s/p L-TKA  07/2022    PT Comments  Pt progressing well this session. Continued to have multiple episodes of knee buckling, ascended 1 step and deferred 2nd step d/t R knee buckling using posterior technique. Will see for second session this afternoon to determine d/c readiness    If plan is discharge home, recommend the following: Help with stairs or ramp for entrance;Assistance with cooking/housework;Assist for transportation   Can travel by private vehicle        Equipment Recommendations  None recommended by PT    Recommendations for Other Services       Precautions / Restrictions Precautions Precautions: Fall;Knee Recall of Precautions/Restrictions: Intact Required Braces or Orthoses: Knee Immobilizer - Right Knee Immobilizer - Right: Discontinue once straight leg raise with < 10 degree lag Restrictions Weight Bearing Restrictions Per Provider Order: No Other Position/Activity Restrictions: WBAT     Mobility  Bed Mobility Overal bed mobility: Needs Assistance Bed Mobility: Supine to Sit     Supine to sit: Supervision     General bed mobility comments: for safety    Transfers Overall transfer level: Needs assistance Equipment used: Rolling walker (2 wheels) Transfers: Sit to/from Stand Sit to Stand: Min assist           General transfer comment: cues for hand placement and RLE position    Ambulation/Gait Ambulation/Gait assistance: Min assist Gait Distance (Feet): 70 Feet Assistive device: Rolling walker (2 wheels) Gait Pattern/deviations: Step-to pattern, Decreased stance time - right Gait velocity: decr     General Gait Details: cues for sequence, step pattern for safety. multiple episodes of R knee buckling, cues for compensatory  use of UEs to offload RLE   Stairs Stairs: Yes Stairs assistance: Min assist Stair Management: Step to pattern, Backwards Number of Stairs: 1 General stair comments: cues for sequence, terminal knee extension on RLE, use of UEs. deferred second step d/t R knee buckling   Wheelchair Mobility     Tilt Bed    Modified Rankin (Stroke Patients Only)       Balance                                            Communication Communication Communication: No apparent difficulties  Cognition Arousal: Alert Behavior During Therapy: WFL for tasks assessed/performed   PT - Cognitive impairments: No apparent impairments                         Following commands: Intact      Cueing Cueing Techniques: Verbal cues  Exercises Total Joint Exercises Ankle Circles/Pumps: AROM, Both, 10 reps Quad Sets: AROM, Right, 10 reps Heel Slides: AAROM, Right, 5 reps Straight Leg Raises: AROM, Strengthening, Right, 5 reps    General Comments        Pertinent Vitals/Pain Pain Assessment Pain Assessment: 0-10 Pain Score: 4  Pain Location: right knee Pain Descriptors / Indicators: Aching, Sore Pain Intervention(s): Monitored during session, Premedicated before session, Repositioned, Ice applied, Limited activity within patient's tolerance    Home Living  Prior Function            PT Goals (current goals can now be found in the care plan section) Acute Rehab PT Goals PT Goal Formulation: With patient Time For Goal Achievement: 04/23/24 Potential to Achieve Goals: Good Progress towards PT goals: Progressing toward goals    Frequency    7X/week      PT Plan      Co-evaluation              AM-PAC PT 6 Clicks Mobility   Outcome Measure  Help needed turning from your back to your side while in a flat bed without using bedrails?: A Little Help needed moving from lying on your back to sitting on the side of  a flat bed without using bedrails?: A Little Help needed moving to and from a bed to a chair (including a wheelchair)?: A Little Help needed standing up from a chair using your arms (e.g., wheelchair or bedside chair)?: A Little Help needed to walk in hospital room?: A Little Help needed climbing 3-5 steps with a railing? : A Lot 6 Click Score: 17    End of Session Equipment Utilized During Treatment: Gait belt Activity Tolerance: Patient tolerated treatment well Patient left: in chair;with call bell/phone within reach;with family/visitor present Nurse Communication: Mobility status PT Visit Diagnosis: Other abnormalities of gait and mobility (R26.89);Difficulty in walking, not elsewhere classified (R26.2)     Time: 1106-1130 PT Time Calculation (min) (ACUTE ONLY): 24 min  Charges:    $Gait Training: 23-37 mins PT General Charges $$ ACUTE PT VISIT: 1 Visit                     Kadarious Dikes, PT  Acute Rehab Dept [facility]) [phone]  04/17/2024    [facility] 04/17/2024, 11:41 AM

## 2024-04-17 NOTE — Progress Notes (Signed)
   Subjective: 1 Day Post-Op Procedure(s) (LRB): ARTHROPLASTY, KNEE, TOTAL (Right) Patient reports pain as mild.   Patient seen in rounds by Dr. Melodi. Patient is well, and has had no acute complaints or problems No issues overnight. Denies chest pain, SOB, or calf pain. Foley catheter removed this AM.  We will continue therapy today   Objective: Vital signs in last 24 hours: Temp:  [97.8 F (36.6 C)-99.2 F (37.3 C)] 98 F (36.7 C) (07/15 0505) Pulse Rate:  [69-92] 75 (07/15 0505) Resp:  [14-18] 18 (07/15 0505) BP: (97-126)/(59-67) 106/63 (07/15 0505) SpO2:  [92 %-99 %] 97 % (07/15 0505)  Intake/Output from previous day:  Intake/Output Summary (Last 24 hours) at 04/17/2024 0902 Last data filed at 04/17/2024 0805 Gross per 24 hour  Intake 2274.92 ml  Output 1850 ml  Net 424.92 ml     Intake/Output this shift: Total I/O In: 240 [P.O.:240] Out: -   Labs: Recent Labs    04/17/24 0319  HGB 10.6*   Recent Labs    04/17/24 0319  WBC 12.6*  RBC 3.53*  HCT 31.4*  PLT 160   Recent Labs    04/17/24 0319  NA 132*  K 3.9  CL 100  CO2 23  BUN 25*  CREATININE 1.25*  GLUCOSE 232*  CALCIUM  9.6   No results for input(s): LABPT, INR in the last 72 hours.  Exam: General - Patient is Alert and Oriented Extremity - Neurologically intact Neurovascular intact Sensation intact distally Dorsiflexion/Plantar flexion intact Dressing - dressing C/D/I Motor Function - intact, moving foot and toes well on exam.   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   Weight as of this encounter: 92 kg. Advance diet Up with therapy D/C IV fluids   Patient's anticipated LOS is less than 2 midnights, meeting these requirements: - Younger than 61 - Lives within 1 hour of care - Has a competent adult at home to recover with post-op recover - NO history of  - Chronic pain requiring opiods  - Diabetes  - Coronary Artery Disease  - Heart failure  - Heart attack  - Stroke  - DVT/VTE  - Cardiac arrhythmia  - Respiratory Failure/COPD  - Renal failure  - Anemia  - Advanced Liver disease     DVT Prophylaxis - Aspirin  Weight bearing as tolerated. Continue therapy.  Plan is to go Home after hospital stay. Plan for discharge later today if progresses with therapy and meeting goals. Scheduled [facility] Follow-up in the office in 2 weeks.  The PDMP database was reviewed today prior to any opioid medications being prescribed to this patient.  Roxie Mess, PA-C Orthopedic Surgery [phone] 04/17/2024, 9:02 AM

## 2024-04-17 NOTE — Progress Notes (Signed)
 PT TX NOTE   04/17/24 1400  PT Visit Information  Last PT Received On 04/17/24  Assistance Needed Pt progressing well. Mobility as below, use of KI reviewed for stairs at home. Spouse present for session, supportive and able to assist as needed. Pt and spouse feel ready to d/c home   History of Present Illness Pt is a 63  yo female s/p R TKA on 04/16/24. PMH: DM, GERD, HTN, s/p L-TKA  07/2022  Precautions  Precautions Fall;Knee  Recall of Precautions/Restrictions Intact  Precaution/Restrictions Comments reviewed KI use, donning/doffing and precautions  Required Braces or Orthoses Knee Immobilizer - Right  Knee Immobilizer - Right Discontinue once straight leg raise with < 10 degree lag  Restrictions  Weight Bearing Restrictions Per Provider Order No  Other Position/Activity Restrictions WBAT  Pain Assessment  Pain Assessment 0-10  Pain Score 6  Pain Location right knee  Pain Descriptors / Indicators Aching;Sore  Pain Intervention(s) Monitored during session;Limited activity within patient's tolerance;Premedicated before session;Repositioned  Cognition  Arousal Alert  Behavior During Therapy WFL for tasks assessed/performed  PT - Cognitive impairments No apparent impairments  Following Commands  Following commands Intact  Cueing  Cueing Techniques Verbal cues  Communication  Communication No apparent difficulties  Bed Mobility  General bed mobility comments in recliner  Transfers  Overall transfer level Needs assistance  Equipment used Rolling walker (2 wheels)  Transfers Sit to/from Stand  Sit to Stand Contact guard assist  General transfer comment cues for hand placement and RLE position  Ambulation/Gait  Ambulation/Gait assistance Contact guard assist  Gait Distance (Feet) 80 Feet  Assistive device Rolling walker (2 wheels)  Gait Pattern/deviations Step-to pattern;Decreased stance time - right  General Gait Details cues for sequence, step pattern for safety. no noted  R  knee buckling--KI in place.  cues for compensatory use of UEs to offload RLE as needed  Gait velocity decr  Stairs assistance Min assist  Stair Management Step to pattern;Backwards;With walker  Number of Stairs 2  General stair comments cues for sequence, terminal knee extension on RLE, use of UEs. stability improved, no knee buckling  PT - End of Session  Equipment Utilized During Treatment Gait belt  Activity Tolerance Patient tolerated treatment well  Patient left with call bell/phone within reach;with family/visitor present;in bed  Nurse Communication Mobility status   PT - Assessment/Plan  PT Visit Diagnosis Other abnormalities of gait and mobility (R26.89);Difficulty in walking, not elsewhere classified (R26.2)  PT Frequency (ACUTE ONLY) 7X/week  Follow Up Recommendations Follow physician's recommendations for discharge plan and follow up therapies  Patient can return home with the following Help with stairs or ramp for entrance;Assistance with cooking/housework;Assist for transportation  PT equipment None recommended by PT  AM-PAC PT 6 Clicks Mobility Outcome Measure (Version 2)  Help needed turning from your back to your side while in a flat bed without using bedrails? 3  Help needed moving from lying on your back to sitting on the side of a flat bed without using bedrails? 3  Help needed moving to and from a bed to a chair (including a wheelchair)? 3  Help needed standing up from a chair using your arms (e.g., wheelchair or bedside chair)? 3  Help needed to walk in hospital room? 3  Help needed climbing 3-5 steps with a railing?  3  6 Click Score 18  Consider Recommendation of Discharge To: Home with [facility]  PT Goal Progression  Progress towards PT goals Progressing toward goals  Acute  Rehab PT Goals  PT Goal Formulation With patient  Time For Goal Achievement 04/23/24  Potential to Achieve Goals Good  PT Time Calculation  PT Start Time (ACUTE ONLY) 1350  PT Stop Time (ACUTE  ONLY) 1413  PT Time Calculation (min) (ACUTE ONLY) 23 min  PT General Charges  $$ ACUTE PT VISIT 1 Visit  PT Treatments  $Gait Training 23-37 mins

## 2024-04-17 NOTE — Plan of Care (Signed)
  Problem: Pain Management: Goal: Pain level will decrease with appropriate interventions Outcome: Progressing   Problem: Clinical Measurements: Goal: Postoperative complications will be avoided or minimized Outcome: Progressing   Problem: Activity: Goal: Range of joint motion will improve Outcome: Progressing   Problem: Education: Goal: Knowledge of the prescribed therapeutic regimen will improve Outcome: Progressing   Problem: Coping: Goal: Ability to adjust to condition or change in health will improve Outcome: Progressing   Problem: Safety: Goal: Ability to remain free from injury will improve Outcome: Progressing

## 2024-04-21 ENCOUNTER — Other Ambulatory Visit: Payer: Self-pay | Admitting: Internal Medicine

## 2024-05-01 NOTE — Discharge Summary (Signed)
 Patient ID: Shelley Dixon MRN: [id number] DOB/AGE: 62-Jan-1962 62 y.o.  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 female who was admitted 04/16/2024 for operative treatment ofOA (osteoarthritis) of knee. Patient has severe unremitting pain that affects sleep, daily activities, and work/hobbies. After pre-op clearance the patient was taken to the operating room on 04/16/2024 and underwent  Procedure(s): ARTHROPLASTY, KNEE, TOTAL.    Patient was given perioperative antibiotics:  Anti-infectives (From admission, onward)    Start     Dose/Rate Route Frequency Ordered Stop   04/16/24 1330  ceFAZolin  (ANCEF ) IVPB 2g/100 mL premix        2 g 200 mL/hr over 30 Minutes Intravenous Every 6 hours 04/16/24 1055 04/16/24 1900   04/16/24 0600  ceFAZolin  (ANCEF ) IVPB 2g/100 mL premix        2 g 200 mL/hr over 30 Minutes Intravenous On call to O.R. 04/16/24 0540 04/16/24 0748        Patient was given sequential compression devices, early ambulation, and chemoprophylaxis to prevent DVT.  Patient benefited maximally from hospital stay and there were no complications.    Recent vital signs: No data found.   Recent laboratory studies: No results for input(s): WBC, HGB, HCT, PLT, NA, K, CL, CO2, BUN, CREATININE, GLUCOSE, INR, CALCIUM  in the  last 72 hours.  Invalid input(s): PT, 2   Discharge Medications:   Allergies as of 04/17/2024       Reactions   Atorvastatin    Elevated LFT   Bydureon [exenatide]    Injection site pain   Canagliflozin Other (See Comments)   Severe UTI   Jardiance  [empagliflozin ] Other (See Comments)   Recurrent UTIs   Pioglitazone    swelling   Saxagliptin Other (See Comments)   Not effective   Simvastatin Other (See Comments)   ineffective        Medication List     TAKE these medications    ALPRAZolam  0.5 MG tablet Commonly known as: XANAX  Take 0.25 mg by mouth at bedtime.   aspirin  81 MG chewable tablet Chew 1 tablet (81 mg total) by mouth 2 (two) times daily for 21 days. Then take one 81 mg aspirin  once a day for three weeks. Then discontinue aspirin .   atenolol  50 MG tablet Commonly known as: TENORMIN  Take 50 mg by mouth at bedtime.   chlorhexidine  4 % external liquid Commonly known as: HIBICLENS  Apply 15 mLs (1 Application total) topically as directed for 30 doses. Use as directed daily for 5 days every other week for 6 weeks.   CRANBERRY PO Take 2 tablets by mouth daily.   esomeprazole 40 MG capsule Commonly known as: NEXIUM Take 40 mg by mouth at bedtime.   gabapentin  300 MG capsule Commonly known as: NEURONTIN  Take 1 capsule (300 mg total) by mouth as directed.  Take a 300 mg capsule three times a day for two weeks following surgery.Then take a 300 mg capsule two times a day for two weeks. Then take a 300 mg capsule once a day for two weeks. Then discontinue. What changed:  how much to take how to take this when to take this additional instructions   glipiZIDE  5 MG tablet Commonly known as: GLUCOTROL  Take 1 tablet (5 mg total) by mouth 2 (two) times daily before a meal.   HAIR SKIN & NAILS ADVANCED PO Take 1 tablet by mouth daily.   levocetirizine 5 MG tablet Commonly known as: XYZAL  Take 5 mg by mouth every evening.    lisinopril-hydrochlorothiazide 20-12.5 MG tablet Commonly known as: ZESTORETIC Take 2 tablets by mouth every morning.   MAGNESIUM GLYCINATE PO Take 200 mg by mouth daily.   metFORMIN  500 MG 24 hr tablet Commonly known as: GLUCOPHAGE -XR Take 3 tablets (1,500 mg total) by mouth daily with breakfast. What changed:  how much to take additional instructions   methocarbamol  500 MG tablet Commonly known as: ROBAXIN  Take 1 tablet (500 mg total) by mouth every 6 (six) hours as needed for muscle spasms.   mupirocin  ointment 2 % Commonly known as: BACTROBAN  Place 1 Application into the nose 2 (two) times daily for 60 doses. Use as directed 2 times daily for 5 days every other week for 6 weeks.   ondansetron  4 MG tablet Commonly known as: ZOFRAN  Take 1 tablet (4 mg total) by mouth every 6 (six) hours as needed for nausea.   OVER THE COUNTER MEDICATION Take 1 Package by mouth daily. Zipfizz with bottle of water  with  vitamin b12   OVER THE COUNTER MEDICATION Take 1 Dose by mouth at bedtime. Pure Zzz gummies   oxyCODONE  5 MG immediate release tablet Commonly known as: Oxy IR/ROXICODONE  Take 1-2 tablets (5-10 mg total) by mouth every 6 (six) hours as needed for severe pain (pain score 7-10).   rosuvastatin  5 MG tablet Commonly known as: Crestor  Take 1 tablet (5 mg total) by mouth daily.   scopolamine 1 MG/3DAYS Commonly known as: TRANSDERM-SCOP Place 1 patch onto the skin as needed (Cruise only).   SUPER B COMPLEX PO Take 1 tablet by mouth daily.   tirzepatide  5 MG/0.5ML Pen Commonly known as: MOUNJARO  Inject 5 mg into the skin once a week.   traMADol  50 MG tablet Commonly known as: ULTRAM  Take 1-2 tablets (50-100 mg total) by mouth every 6 (six) hours as needed for moderate pain (pain score 4-6).   Vitamin D 50 MCG (2000 UT) Caps Take 2,000 Units by mouth daily.               Discharge Care Instructions  (From admission, onward)           Start     Ordered    04/17/24 0000  Weight bearing as tolerated        04/17/24 0904   04/17/24 0000  Change dressing       Comments: You may remove the bulky bandage (ACE wrap and gauze) two days after surgery. You will have an adhesive waterproof bandage underneath. Leave this in place until your first follow-up appointment.   04/17/24 0904            Diagnostic Studies: No results found.  Disposition: Discharge disposition: 01-Home or Self Care       Discharge Instructions     Call MD / Call 911   Complete by: As  directed    If you experience chest pain or shortness of breath, CALL 911 and be transported to the hospital emergency room.  If you develope a fever above 101 F, pus (white drainage) or increased drainage or redness at the wound, or calf pain, call your surgeon's office.   Change dressing   Complete by: As directed    You may remove the bulky bandage (ACE wrap and gauze) two days after surgery. You will have an adhesive waterproof bandage underneath. Leave this in place until your first follow-up appointment.   Constipation Prevention   Complete by: As directed    Drink plenty of fluids.  Prune juice may be helpful.  You may use a stool softener, such as Colace (over the counter) 100 mg twice a day.  Use MiraLax  (over the counter) for constipation as needed.   Diet - low sodium heart healthy   Complete by: As directed    Do not put a pillow under the knee. Place it under the heel.   Complete by: As directed    Driving restrictions   Complete by: As directed    No driving for two weeks   Post-operative opioid taper instructions:   Complete by: As directed    POST-OPERATIVE OPIOID TAPER INSTRUCTIONS: It is important to wean off of your opioid medication as soon as possible. If you do not need pain medication after your surgery it is ok to stop day one. Opioids include: Codeine, Hydrocodone(Norco, Vicodin), Oxycodone (Percocet, oxycontin ) and hydromorphone  amongst others.  Long  term and even short term use of opiods can cause: Increased pain response Dependence Constipation Depression Respiratory depression And more.  Withdrawal symptoms can include Flu like symptoms Nausea, vomiting And more Techniques to manage these symptoms Hydrate well Eat regular healthy meals Stay active Use relaxation techniques(deep breathing, meditating, yoga) Do Not substitute Alcohol to help with tapering If you have been on opioids for less than two weeks and do not have pain than it is ok to stop all together.  Plan to wean off of opioids This plan should start within one week post op of your joint replacement. Maintain the same interval or time between taking each dose and first decrease the dose.  Cut the total daily intake of opioids by one tablet each day Next start to increase the time between doses. The last dose that should be eliminated is the evening dose.      TED hose   Complete by: As directed    Use stockings (TED hose) for three weeks on both leg(s).  You may remove them at night for sleeping.   Weight bearing as tolerated   Complete by: As directed         Follow-up Information     Aluisio, Dempsey, MD. Schedule an appointment as soon as possible for a visit in 2 week(s).   Specialty: Orthopedic Surgery Contact information: [address] [place] [address] [phone]                  Signed: Roxie Mess 05/01/2024, 8:30 AM

## 2024-06-12 NOTE — Progress Notes (Unsigned)
 Name: Shelley Dixon  Age/ Sex: 63 y.o., female   MRN/ DOB: [id number], 01-14-1961     PCP: Auston Opal, DO   Reason for Endocrinology Evaluation: Type 2 Diabetes Mellitus  Initial Endocrine Consultative Visit: 11/08/2019      PATIENT IDENTIFIER: Shelley Dixon is a 63 y.o. female with a past medical history of T2Dm, HTN, CKD and Dyslipidemia . The patient has followed with Endocrinology clinic since 11/08/2019 for consultative assistance with management of her diabetes.  DIABETIC HISTORY:  Shelley Dixon was diagnosed with DM many years ago. She reports intolerance to invokana - yeast infections. She recalls developing swelling  to Actos may have been swelling . Her hemoglobin A1c has ranged from 5.8% years ago , peaking at 10.9%  in 2021.  On her initial visit to our clinic she had an A1c of  10.9%  , she was on repaglinide, metformin , glimepiride and truclicity . We switched the repaglinide, and glimepiride to basal insulin , continued trulicity and metformin    Pt developed intolerance to Trulicity - vomiting and nausea. She already has reported intolerance of SGLT-2 inhibitors   Jardiance  started 06/2020   Decreased Metformin  due to diarrhea 02/2021  Start Ozempic  05/2021  Stopped Jardiance  03/2022 due to recurrent Uti's   Switched Ozempic  to mounjaro  09/2023 due to vomiting with 2 mg dose    Started Glipizide  06/2023  SUBJECTIVE:   During the last visit (12/12/2023): A1c 6.3%      Today (06/12/2024): Shelley Dixon is here for a follow up on diabetes management.She has checks her blood sugars 1 times daily .  The patient has not had hypoglycemic episodes since the last clinic visit.   Denies nausea or vomiting  Denies constipation or diarrhea    HOME ENDOCRINE  REGIMEN:  Glipizide  5 mg BID  Metformin  500 mg XR 1 tablet QAM and 2 tabs QPM  Mounjaro  5 mg weekly Toujeo  38 units daily  Rosuvastatin  5 mg daily     Statin: yes ACE-I/ARB: yes   METER  DOWNLOAD SUMMARY: 2/24-3/07/2024  Average Number Tests/Day = 0.4 Overall Mean FS Glucose = 113   BG Ranges: Low = 100 High = 133  BG Target % Results: % In target = 100 % Over target = 0 % Under target = 0  Hypoglycemic Events/30 Days: BG < 50 = 0 Episodes of symptomatic severe hypoglycemia = 0       DIABETIC COMPLICATIONS: Microvascular complications:    Denies: retinopathy, neuropathy , CKD  Last eye exam: Completed   Macrovascular complications:    Denies: CAD, PVD, CVA    HISTORY:  Past Medical History:  Past Medical History:  Diagnosis Date   Arthritis    Cancer (HCC)    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   Diabetes mellitus without complication (HCC)    GERD (gastroesophageal reflux disease)    History of kidney stones    Hypercholesteremia    Hypertension    Irregular heart beat    PCP aware per pt   Past Surgical History:  Past Surgical History:  Procedure Laterality Date   COLONOSCOPY     KNEE ARTHROSCOPY Left    ovarian tumor     TOTAL KNEE ARTHROPLASTY Left 07/05/2022   Procedure: TOTAL KNEE ARTHROPLASTY;  Surgeon: Melodi Lerner, MD;  Location: [facility];  Service: Orthopedics;  Laterality: Left;   TOTAL KNEE ARTHROPLASTY Right 04/16/2024   Procedure: ARTHROPLASTY, KNEE, TOTAL;  Surgeon: Melodi Lerner, MD;  Location: [facility];  Service: Orthopedics;  Laterality: Right;   WISDOM TOOTH EXTRACTION     Social History:  reports that she has quit smoking. Her smoking use included cigarettes. She has never used smokeless tobacco. She reports that she does not currently use alcohol. She reports that she does not use drugs. Family History: No family history on file.   HOME MEDICATIONS: Allergies as of 06/13/2024       Reactions   Atorvastatin    Elevated LFT   Bydureon [exenatide]    Injection site pain   Canagliflozin Other (See Comments)   Severe UTI   Jardiance  [empagliflozin ] Other (See Comments)   Recurrent UTIs   Pioglitazone    swelling    Saxagliptin Other (See Comments)   Not effective   Simvastatin Other (See Comments)   ineffective        Medication List        Accurate as of June 12, 2024  2:48 PM. If you have any questions, ask your nurse or doctor.          ALPRAZolam  0.5 MG tablet Commonly known as: XANAX  Take 0.25 mg by mouth at bedtime.   atenolol  50 MG tablet Commonly known as: TENORMIN  Take 50 mg by mouth at bedtime.   chlorhexidine  4 % external liquid Commonly known as: HIBICLENS  Apply 15 mLs (1 Application total) topically as directed for 30 doses. Use as directed daily for 5 days every other week for 6 weeks.   CRANBERRY PO Take 2 tablets by mouth daily.   esomeprazole 40 MG capsule Commonly known as: NEXIUM Take 40 mg by mouth at bedtime.   gabapentin  300 MG capsule Commonly known as: NEURONTIN  Take 1 capsule (300 mg total) by mouth as directed. Take a 300 mg capsule three times a day for two weeks following surgery.Then take a 300 mg capsule two times a day for two weeks. Then take a 300 mg capsule once a day for two weeks. Then discontinue.   glipiZIDE  5 MG tablet Commonly known as: GLUCOTROL  Take 1 tablet (5 mg total) by mouth 2 (two) times daily before a meal.   HAIR SKIN & NAILS ADVANCED PO Take 1 tablet by mouth daily.   levocetirizine 5 MG tablet Commonly known as: XYZAL  Take 5 mg by mouth every evening.   lisinopril-hydrochlorothiazide 20-12.5 MG tablet Commonly known as: ZESTORETIC Take 2 tablets by mouth every morning.   MAGNESIUM GLYCINATE PO Take 200 mg by mouth daily.   metFORMIN  500 MG 24 hr tablet Commonly known as: GLUCOPHAGE -XR Take 3 tablets (1,500 mg total) by mouth daily with breakfast. What changed:  how much to take additional instructions   methocarbamol  500 MG tablet Commonly known as: ROBAXIN  Take 1 tablet (500 mg total) by mouth every 6 (six) hours as needed for muscle spasms.   ondansetron  4 MG tablet Commonly known as: ZOFRAN  Take  1 tablet (4 mg total) by mouth every 6 (six) hours as needed for nausea.   OVER THE COUNTER MEDICATION Take 1 Package by mouth daily. Zipfizz with bottle of water  with  vitamin b12   OVER THE COUNTER MEDICATION Take 1 Dose by mouth at bedtime. Pure Zzz gummies   oxyCODONE  5 MG immediate release tablet Commonly known as: Oxy IR/ROXICODONE  Take 1-2 tablets (5-10 mg total) by mouth every 6 (six) hours as needed for severe pain (pain score 7-10).   rosuvastatin  5 MG tablet Commonly known as: Crestor  Take 1 tablet (5 mg total) by mouth daily.   scopolamine 1 MG/3DAYS Commonly  known as: TRANSDERM-SCOP Place 1 patch onto the skin as needed (C[place] only).   SUPER B COMPLEX PO Take 1 tablet by mouth daily.   tirzepatide  5 MG/0.5ML Pen Commonly known as: MOUNJARO  Inject 5 mg into the skin once a week.   Toujeo  SoloStar 300 UNIT/ML Solostar Pen Generic drug: insulin  glargine (1 Unit Dial ) Inject 38 Units into the skin at bedtime.   traMADol  50 MG tablet Commonly known as: ULTRAM  Take 1-2 tablets (50-100 mg total) by mouth every 6 (six) hours as needed for moderate pain (pain score 4-6).   Vitamin D 50 MCG (2000 UT) Caps Take 2,000 Units by mouth daily.          OBJECTIVE:   Vital Signs: There were no vitals taken for this visit.  Wt Readings from Last 3 Encounters:  04/16/24 202 lb 13.2 oz (92 kg)  04/03/24 203 lb (92.1 kg)  12/12/23 208 lb (94.3 kg)     Exam: General: Pt appears well and is in NAD  Lungs: Clear with good BS bilat   Heart: RRR   Extremities: No pretibial edema.  Neuro: MS is good with appropriate affect, pt is alert and Ox3    DM foot exam:12/12/2023   The skin of the feet is intact without sores or ulcerations. The pedal pulses are 2+ on right and 2+ on left. The sensation is intact to a screening 5.07, 10 gram monofilament bilaterally    DATA REVIEWED: 04/27/2024 BUN 17 Cr. 1.04 GFR 60 LDL 52 TG 145 HDL 42     ASSESSMENT / PLAN /  RECOMMENDATIONS:   1) Type 2 Diabetes Mellitus, Optimally controlled, Without complications - Most recent A1c of 6.3 %. Goal A1c < 7.0 %.   -A1c has optimized - Insurance did not cover dexcom  - She developed severe nausea and vomiting to Trulicity,as well as on Ozempic  2 mg  - She is intolerant to pioglitazone -Intolerant to Jardiance  due to recurrent genital infections -Tolerating Mounjaro  without side effects, patient opted to remain on current dose and not to increase at this time -Due to tight BG's, I have recommended decreasing Toujeo  as below  MEDICATIONS: -Continue Glipizide  5 mg twice daily  -Continue  Metfomrin 1 tablet in the morning and 2 tablets with Supper -Decrease Toujeo  38 units daily  -Continue  mounjaro  5 mg weekly     EDUCATION / INSTRUCTIONS: BG monitoring instructions: Patient is instructed to check her blood sugars 1 times a day,  fasting Call [place] [facility] if: BG persistently < 70  I reviewed the Rule of 15 for the treatment of hypoglycemia in detail with the patient. Literature supplied.    2) Diabetic complications:  Eye: Does not have known diabetic retinopathy.  Neuro/ Feet: Does not have known diabetic peripheral neuropathy .  Renal: Patient does not have known baseline CKD. She   is on an ACEI/ARB at present.      3) Dyslipidemia :   -Historically she has had LDL above goal -As well as elevated LFTs to atorvastatin -She has history of intolerance to simvastatin -I had switched pravastatin to rosuvastatin  06/2020 -LDL at goal   Medications  Continue Rosuvastatin  5 mg daily    F/U in 3 months    Signed electronically by: Stefano Redgie Butts, MD  [facility] Endocr[facility]ology  [facility] [address] [place] [place], [place] [address] Phone: [phone] FAX: [phone]   CC: Auston Opal, DO [address] [place]  [address] Phone: [phone]  Fax: [phone]  Return  to Endocrinology clinic as below: Future Appointments  Date Time Provider Department Center  06/13/2024  7:50 AM Verland Sprinkle, Donell Cardinal, MD [id number] None

## 2024-06-13 ENCOUNTER — Ambulatory Visit (INDEPENDENT_AMBULATORY_CARE_PROVIDER_SITE_OTHER): Admitting: Internal Medicine

## 2024-06-13 ENCOUNTER — Encounter: Payer: Self-pay | Admitting: Internal Medicine

## 2024-06-13 VITALS — BP 124/82 | HR 90 | Ht 63.0 in | Wt 198.4 lb

## 2024-06-13 DIAGNOSIS — E782 Mixed hyperlipidemia: Secondary | ICD-10-CM | POA: Diagnosis not present

## 2024-06-13 DIAGNOSIS — Z794 Long term (current) use of insulin: Secondary | ICD-10-CM

## 2024-06-13 DIAGNOSIS — E119 Type 2 diabetes mellitus without complications: Secondary | ICD-10-CM

## 2024-06-13 LAB — POCT GLYCOSYLATED HEMOGLOBIN (HGB A1C): Hemoglobin A1C: 5.9 % — AB (ref 4.0–5.6)

## 2024-06-13 MED ORDER — METFORMIN HCL ER 500 MG PO TB24: ORAL_TABLET | ORAL | 3 refills | Status: AC

## 2024-06-13 MED ORDER — GLIPIZIDE 5 MG PO TABS: 5.0000 mg | ORAL_TABLET | Freq: Two times a day (BID) | ORAL | 3 refills | Status: AC

## 2024-06-13 MED ORDER — MOUNJARO 7.5 MG/0.5ML ~~LOC~~ SOAJ: 7.5000 mg | SUBCUTANEOUS | 3 refills | Status: AC

## 2024-06-13 NOTE — Patient Instructions (Addendum)
-   Stop Toujeo  ( Let me know if fasting sugars are consistently over 150) - Continue Metformin  500 mg, 1 tablet in the morning and 2 in the evening - Continued mounjaro  7.5 mg weekly  - Continue Glipizide  5 mg, 1 tablet before Breakfast and 1 tablet before Supper       HOW TO TREAT LOW BLOOD SUGARS (Blood sugar LESS THAN 70 MG/DL) Please follow the RULE OF 15 for the treatment of hypoglycemia treatment (when your (blood sugars are less than 70 mg/dL)   STEP 1: Take 15 grams of carbohydrates when your blood sugar is low, which includes:  3-4 GLUCOSE TABS  OR 3-4 OZ OF JUICE OR REGULAR SODA OR ONE TUBE OF GLUCOSE GEL    STEP 2: RECHECK blood sugar in 15 MINUTES STEP 3: If your blood sugar is still low at the 15 minute recheck --> then, go back to STEP 1 and treat AGAIN with another 15 grams of carbohydrates.

## 2024-06-14 ENCOUNTER — Ambulatory Visit: Payer: Self-pay | Admitting: Internal Medicine

## 2024-06-14 LAB — LIPID PANEL
Cholesterol: 144 mg/dL (ref <200)
HDL: 40 mg/dL — ABNORMAL LOW (ref >=50)
LDL Cholesterol (Calc): 76 mg/dL
Non-HDL Cholesterol (Calc): 104 mg/dL (ref <130)
Total CHOL/HDL Ratio: 3.6 (calc) (ref <5.0)
Triglycerides: 189 mg/dL — ABNORMAL HIGH (ref <150)

## 2024-06-14 LAB — MICROALBUMIN / CREATININE URINE RATIO
Creatinine, Urine: 114 mg/dL (ref 20–275)
Microalb Creat Ratio: 17 mg/g{creat} (ref <30)
Microalb, Ur: 1.9 mg/dL

## 2024-06-14 MED ORDER — ROSUVASTATIN CALCIUM 5 MG PO TABS: 5.0000 mg | ORAL_TABLET | Freq: Every day | ORAL | 3 refills | Status: AC

## 2024-12-11 ENCOUNTER — Ambulatory Visit: Admitting: Internal Medicine
